# Patient Record
Sex: Female | Born: 1950 | Race: White | Hispanic: No | Marital: Married | Smoking: Never smoker
Health system: Southern US, Community
[De-identification: ages and names within clinical notes are randomized; demographics above are authoritative.]

## PROBLEM LIST (undated history)

## (undated) DIAGNOSIS — D869 Sarcoidosis, unspecified: Secondary | ICD-10-CM

## (undated) DIAGNOSIS — E039 Hypothyroidism, unspecified: Secondary | ICD-10-CM

## (undated) DIAGNOSIS — Z9889 Other specified postprocedural states: Secondary | ICD-10-CM

## (undated) DIAGNOSIS — R2990 Unspecified symptoms and signs involving the nervous system: Secondary | ICD-10-CM

## (undated) DIAGNOSIS — K219 Gastro-esophageal reflux disease without esophagitis: Secondary | ICD-10-CM

## (undated) DIAGNOSIS — E785 Hyperlipidemia, unspecified: Secondary | ICD-10-CM

## (undated) DIAGNOSIS — Z9109 Other allergy status, other than to drugs and biological substances: Secondary | ICD-10-CM

## (undated) DIAGNOSIS — I10 Essential (primary) hypertension: Secondary | ICD-10-CM

## (undated) DIAGNOSIS — R112 Nausea with vomiting, unspecified: Secondary | ICD-10-CM

## (undated) DIAGNOSIS — M1990 Unspecified osteoarthritis, unspecified site: Secondary | ICD-10-CM

## (undated) HISTORY — DX: Essential (primary) hypertension: I10

## (undated) HISTORY — PX: OTHER SURGICAL HISTORY: SHX169

## (undated) HISTORY — DX: Hypothyroidism, unspecified: E03.9

## (undated) HISTORY — DX: Sarcoidosis, unspecified: D86.9

## (undated) HISTORY — DX: Other allergy status, other than to drugs and biological substances: Z91.09

## (undated) HISTORY — DX: Hyperlipidemia, unspecified: E78.5

---

## 1976-12-07 HISTORY — PX: TUBAL LIGATION: SHX77

## 1994-07-09 HISTORY — PX: OTHER SURGICAL HISTORY: SHX169

## 1998-02-02 ENCOUNTER — Ambulatory Visit (HOSPITAL_COMMUNITY): Admission: RE | Admit: 1998-02-02 | Discharge: 1998-02-02 | Payer: Self-pay | Admitting: Neurosurgery

## 1998-03-05 ENCOUNTER — Ambulatory Visit (HOSPITAL_BASED_OUTPATIENT_CLINIC_OR_DEPARTMENT_OTHER): Admission: RE | Admit: 1998-03-05 | Discharge: 1998-03-05 | Payer: Self-pay | Admitting: Orthopedic Surgery

## 1998-04-08 HISTORY — PX: OTHER SURGICAL HISTORY: SHX169

## 1999-06-27 ENCOUNTER — Encounter: Admission: RE | Admit: 1999-06-27 | Discharge: 1999-06-27 | Payer: Self-pay | Admitting: Family Medicine

## 1999-06-27 ENCOUNTER — Encounter: Payer: Self-pay | Admitting: Family Medicine

## 1999-10-10 HISTORY — PX: OTHER SURGICAL HISTORY: SHX169

## 1999-10-18 ENCOUNTER — Other Ambulatory Visit: Admission: RE | Admit: 1999-10-18 | Discharge: 1999-10-18 | Payer: Self-pay | Admitting: Family Medicine

## 1999-10-22 ENCOUNTER — Other Ambulatory Visit: Admission: RE | Admit: 1999-10-22 | Discharge: 1999-10-22 | Payer: Self-pay | Admitting: Gynecology

## 1999-10-22 ENCOUNTER — Encounter (INDEPENDENT_AMBULATORY_CARE_PROVIDER_SITE_OTHER): Payer: Self-pay | Admitting: Specialist

## 1999-11-06 ENCOUNTER — Encounter: Payer: Self-pay | Admitting: Orthopaedic Surgery

## 1999-11-06 ENCOUNTER — Encounter: Admission: RE | Admit: 1999-11-06 | Discharge: 1999-11-06 | Payer: Self-pay | Admitting: Orthopaedic Surgery

## 1999-12-08 HISTORY — PX: OTHER SURGICAL HISTORY: SHX169

## 1999-12-13 ENCOUNTER — Encounter: Payer: Self-pay | Admitting: Family Medicine

## 1999-12-13 ENCOUNTER — Encounter: Admission: RE | Admit: 1999-12-13 | Discharge: 1999-12-13 | Payer: Self-pay | Admitting: Family Medicine

## 1999-12-27 ENCOUNTER — Encounter: Payer: Self-pay | Admitting: Surgery

## 2000-01-01 ENCOUNTER — Ambulatory Visit (HOSPITAL_COMMUNITY): Admission: RE | Admit: 2000-01-01 | Discharge: 2000-01-02 | Payer: Self-pay | Admitting: Surgery

## 2000-01-01 ENCOUNTER — Encounter (INDEPENDENT_AMBULATORY_CARE_PROVIDER_SITE_OTHER): Payer: Self-pay | Admitting: *Deleted

## 2000-03-09 ENCOUNTER — Encounter: Payer: Self-pay | Admitting: Gastroenterology

## 2000-03-09 ENCOUNTER — Ambulatory Visit (HOSPITAL_COMMUNITY): Admission: RE | Admit: 2000-03-09 | Discharge: 2000-03-09 | Payer: Self-pay | Admitting: Gastroenterology

## 2000-05-15 ENCOUNTER — Encounter: Admission: RE | Admit: 2000-05-15 | Discharge: 2000-05-15 | Payer: Self-pay | Admitting: Gastroenterology

## 2000-05-15 ENCOUNTER — Encounter: Payer: Self-pay | Admitting: Gastroenterology

## 2000-06-12 ENCOUNTER — Ambulatory Visit (HOSPITAL_COMMUNITY): Admission: RE | Admit: 2000-06-12 | Discharge: 2000-06-12 | Payer: Self-pay | Admitting: Gastroenterology

## 2000-06-30 ENCOUNTER — Encounter: Admission: RE | Admit: 2000-06-30 | Discharge: 2000-06-30 | Payer: Self-pay | Admitting: Family Medicine

## 2000-06-30 ENCOUNTER — Encounter: Payer: Self-pay | Admitting: Family Medicine

## 2000-11-03 ENCOUNTER — Other Ambulatory Visit: Admission: RE | Admit: 2000-11-03 | Discharge: 2000-11-03 | Payer: Self-pay | Admitting: Gynecology

## 2001-03-18 ENCOUNTER — Encounter: Payer: Self-pay | Admitting: Gastroenterology

## 2001-03-18 ENCOUNTER — Encounter: Admission: RE | Admit: 2001-03-18 | Discharge: 2001-03-18 | Payer: Self-pay | Admitting: Gastroenterology

## 2001-07-01 ENCOUNTER — Encounter: Payer: Self-pay | Admitting: Family Medicine

## 2001-07-01 ENCOUNTER — Encounter: Admission: RE | Admit: 2001-07-01 | Discharge: 2001-07-01 | Payer: Self-pay | Admitting: Family Medicine

## 2001-11-04 ENCOUNTER — Other Ambulatory Visit: Admission: RE | Admit: 2001-11-04 | Discharge: 2001-11-04 | Payer: Self-pay | Admitting: Gynecology

## 2002-07-05 ENCOUNTER — Encounter: Admission: RE | Admit: 2002-07-05 | Discharge: 2002-07-05 | Payer: Self-pay | Admitting: Family Medicine

## 2002-07-05 ENCOUNTER — Encounter: Payer: Self-pay | Admitting: Family Medicine

## 2002-08-02 ENCOUNTER — Encounter: Payer: Self-pay | Admitting: Cardiology

## 2002-08-02 ENCOUNTER — Encounter: Admission: RE | Admit: 2002-08-02 | Discharge: 2002-08-02 | Payer: Self-pay | Admitting: Cardiology

## 2002-08-24 ENCOUNTER — Ambulatory Visit (HOSPITAL_COMMUNITY): Admission: RE | Admit: 2002-08-24 | Discharge: 2002-08-24 | Payer: Self-pay | Admitting: Cardiology

## 2002-08-24 ENCOUNTER — Encounter (INDEPENDENT_AMBULATORY_CARE_PROVIDER_SITE_OTHER): Payer: Self-pay | Admitting: Cardiology

## 2002-09-08 DIAGNOSIS — D869 Sarcoidosis, unspecified: Secondary | ICD-10-CM

## 2002-09-08 HISTORY — DX: Sarcoidosis, unspecified: D86.9

## 2002-09-08 HISTORY — PX: MEDIASTINOSCOPY: SUR861

## 2002-11-17 ENCOUNTER — Other Ambulatory Visit: Admission: RE | Admit: 2002-11-17 | Discharge: 2002-11-17 | Payer: Self-pay | Admitting: Gynecology

## 2002-12-29 ENCOUNTER — Encounter: Admission: RE | Admit: 2002-12-29 | Discharge: 2003-01-13 | Payer: Self-pay | Admitting: Otolaryngology

## 2003-02-27 ENCOUNTER — Encounter: Payer: Self-pay | Admitting: Family Medicine

## 2003-02-27 ENCOUNTER — Encounter: Admission: RE | Admit: 2003-02-27 | Discharge: 2003-02-27 | Payer: Self-pay | Admitting: Family Medicine

## 2003-03-09 ENCOUNTER — Encounter: Admission: RE | Admit: 2003-03-09 | Discharge: 2003-03-09 | Payer: Self-pay | Admitting: Family Medicine

## 2003-03-09 ENCOUNTER — Encounter: Payer: Self-pay | Admitting: Family Medicine

## 2003-03-15 ENCOUNTER — Encounter: Payer: Self-pay | Admitting: Family Medicine

## 2003-03-15 ENCOUNTER — Encounter: Admission: RE | Admit: 2003-03-15 | Discharge: 2003-03-15 | Payer: Self-pay | Admitting: Family Medicine

## 2003-03-20 ENCOUNTER — Encounter: Payer: Self-pay | Admitting: Thoracic Surgery

## 2003-03-22 ENCOUNTER — Ambulatory Visit (HOSPITAL_COMMUNITY): Admission: RE | Admit: 2003-03-22 | Discharge: 2003-03-22 | Payer: Self-pay | Admitting: Thoracic Surgery

## 2003-03-22 ENCOUNTER — Encounter (INDEPENDENT_AMBULATORY_CARE_PROVIDER_SITE_OTHER): Payer: Self-pay | Admitting: Specialist

## 2003-06-12 ENCOUNTER — Other Ambulatory Visit: Admission: RE | Admit: 2003-06-12 | Discharge: 2003-06-12 | Payer: Self-pay | Admitting: Family Medicine

## 2003-07-28 ENCOUNTER — Encounter: Admission: RE | Admit: 2003-07-28 | Discharge: 2003-07-28 | Payer: Self-pay | Admitting: Family Medicine

## 2004-06-20 ENCOUNTER — Other Ambulatory Visit: Admission: RE | Admit: 2004-06-20 | Discharge: 2004-06-20 | Payer: Self-pay | Admitting: Family Medicine

## 2004-07-30 ENCOUNTER — Encounter: Admission: RE | Admit: 2004-07-30 | Discharge: 2004-07-30 | Payer: Self-pay | Admitting: Family Medicine

## 2004-10-24 ENCOUNTER — Ambulatory Visit: Payer: Self-pay | Admitting: Internal Medicine

## 2004-11-01 ENCOUNTER — Ambulatory Visit (HOSPITAL_COMMUNITY): Admission: RE | Admit: 2004-11-01 | Discharge: 2004-11-01 | Payer: Self-pay | Admitting: Internal Medicine

## 2005-08-05 ENCOUNTER — Encounter: Admission: RE | Admit: 2005-08-05 | Discharge: 2005-08-05 | Payer: Self-pay | Admitting: Family Medicine

## 2006-08-07 ENCOUNTER — Encounter: Admission: RE | Admit: 2006-08-07 | Discharge: 2006-08-07 | Payer: Self-pay | Admitting: Family Medicine

## 2006-09-28 ENCOUNTER — Other Ambulatory Visit: Admission: RE | Admit: 2006-09-28 | Discharge: 2006-09-28 | Payer: Self-pay | Admitting: Family Medicine

## 2007-03-31 ENCOUNTER — Encounter: Admission: RE | Admit: 2007-03-31 | Discharge: 2007-05-26 | Payer: Self-pay | Admitting: Family Medicine

## 2007-08-10 ENCOUNTER — Encounter: Admission: RE | Admit: 2007-08-10 | Discharge: 2007-08-10 | Payer: Self-pay | Admitting: Family Medicine

## 2007-09-30 ENCOUNTER — Other Ambulatory Visit: Admission: RE | Admit: 2007-09-30 | Discharge: 2007-09-30 | Payer: Self-pay | Admitting: Family Medicine

## 2008-08-14 ENCOUNTER — Encounter: Admission: RE | Admit: 2008-08-14 | Discharge: 2008-08-14 | Payer: Self-pay | Admitting: Family Medicine

## 2008-10-23 ENCOUNTER — Other Ambulatory Visit: Admission: RE | Admit: 2008-10-23 | Discharge: 2008-10-23 | Payer: Self-pay | Admitting: Family Medicine

## 2009-08-15 ENCOUNTER — Encounter: Admission: RE | Admit: 2009-08-15 | Discharge: 2009-08-15 | Payer: Self-pay | Admitting: Family Medicine

## 2009-10-29 ENCOUNTER — Other Ambulatory Visit: Admission: RE | Admit: 2009-10-29 | Discharge: 2009-10-29 | Payer: Self-pay | Admitting: Family Medicine

## 2010-09-20 ENCOUNTER — Encounter
Admission: RE | Admit: 2010-09-20 | Discharge: 2010-09-20 | Payer: Self-pay | Source: Home / Self Care | Attending: Family Medicine | Admitting: Family Medicine

## 2011-01-21 ENCOUNTER — Ambulatory Visit (HOSPITAL_COMMUNITY)
Admission: RE | Admit: 2011-01-21 | Discharge: 2011-01-21 | Disposition: A | Discharge status: Home / Self Care | Payer: 59 | Source: Ambulatory Visit | Attending: Gastroenterology | Admitting: Gastroenterology

## 2011-01-21 DIAGNOSIS — R1310 Dysphagia, unspecified: Secondary | ICD-10-CM | POA: Insufficient documentation

## 2011-01-21 DIAGNOSIS — R059 Cough, unspecified: Secondary | ICD-10-CM | POA: Insufficient documentation

## 2011-01-21 DIAGNOSIS — R05 Cough: Secondary | ICD-10-CM | POA: Insufficient documentation

## 2011-01-21 DIAGNOSIS — R490 Dysphonia: Secondary | ICD-10-CM | POA: Insufficient documentation

## 2011-01-21 DIAGNOSIS — K219 Gastro-esophageal reflux disease without esophagitis: Secondary | ICD-10-CM | POA: Insufficient documentation

## 2011-01-24 NOTE — Discharge Summary (Signed)
NAME:  Yolanda Oconnell, Yolanda Oconnell NO.:  [id number]   MEDICAL RECORD NO.:  [id number]                   PATIENT TYPE:  OUT   LOCATION:  EKG                                  FACILITY:  [facility]   PHYSICIAN:  Ethelene Browns., M.D.            DATE OF BIRTH:  13-Feb-1951   DATE OF ADMISSION:  10/31/2002  DATE OF DISCHARGE:  11/03/2002                                 DISCHARGE SUMMARY   HISTORY OF PRESENT ILLNESS:  This 60 year old female with a chief complaint  of urgency, frequency every 20 to 30 minutes, and dysuria, has a history of  interstitial cystitis and recurrent urinary tract infections dating back to  1973, and has been followed in our office since 1985.  Her last cystoscopy  and hydrodistention was in 12/03.  She did well until about one to two weeks  ago when she began having increasing dysuria and frequency, no gross blood,  gravel, or stone passed.  The patient was admitted on the day prior to  surgery because of hemoglobin of 7, and Dr. Evlyn Kanner transfused her with 2  units of packed red blood cells.  On 11/01/02, she had cystoscopy and  hydrodistention, corpectomy, irrigation of the bladder, carried out under  general anesthesia.   Postoperatively, she initially seemed to do well, but on the following  morning, 11/02/02, was short of breath and had poor output.  Creatinine had  increased to 2.2 from 1.7, and she had interstitial edema on her chest x-  ray.  She was treated with IV Lasix by Dr. Evlyn Kanner, and she diuresed  sufficiently, felt much better on 11/03/02, and was discharged.  Creatinine  was still 2.2, BUN 54.  The patient did receive gentamycin 80 mg  preoperatively IV.   ALLERGIES:  1. PENICILLIN.  2. SULFA.  3. CIPRO.  4. KEFLEX.  5. CODEINE.   MEDICATIONS:  1. Insulin as ordered by Dr. Evlyn Kanner which is Novolog sliding scale, NPH 29     units q.a.m. and 19 units h.s.  2. Trazodone 50 mg daily.  3. Lotrel 5 to 10 mg daily.  4. Allopurinol  300 mg daily.  5. Prevacid 15 mg daily.  6. Lorcet one q.4h. p.r.n. pain.  7. Macrobid 100 mg daily p.r.n. for infections.  8. Paxil CR 25 mg daily.  9. Coronex 5 mg daily, and Dr. Evlyn Kanner is going to add to this medication     Lasix 20 mg daily at discharge.   The patient's past history, family history, social history, and review of  systems are well documented in the H&P.  Her problem list includes:   PROBLEM LIST:  1. Interstitial cystitis.  2. Diabetes mellitus with nephropathy.  3. Carcinoma of the breast in 1993, left mastectomy.  4. Anemia with hemoglobin 7, and after transfusion hemoglobin rose to 10.1.  5. Osteoarthritis.  6. Hiatal hernia.  7. Chronic obstructive pulmonary  disease.  8. Chronic anxiety.  9. History of pneumonia.   PHYSICAL EXAMINATION:  GENERAL:  A well-developed, obese, 60 year old  female.  VITAL SIGNS:  Blood pressure 142/60, respirations 20, pulse 72, temperature  98.  HEENT:  She is edentulous.  NECK:  Free of nodes, no enlarged thyroid.  CHEST:  Clear to auscultation and percussion.  RESPIRATORY:  Respiratory excursion is decreased.  HEART:  Normal sinus rhythm, no murmur.  BREASTS:  Left absent, right not examined.  ABDOMEN:  Obese.  There is a low midline scar x2, and a low transverse scar  with suprapubic tenderness.  Liver, kidneys, and spleen mass or hernia not  detected.  PELVIC:  The bladder is very tender.  Rectal tone is poor, and support is  poor.  EXTREMITIES:  No edema, good pulses.  NEUROLOGIC:  She seemed intact.  SKIN:  No rashes.  LYMPHATIC:  No nodes.   LABORATORY DATA:  The x-ray and laboratory findings reveal cardiomegaly,  mild interstitial edema postoperatively.  Her labs at discharge showed a  white blood cell count of 14,300, hemoglobin 10.1, hematocrit 31.  On  admission, her hematocrit had been 22, and her hemoglobin was 7.3.  Creatinine 2.2, BUN 54, potassium was 5.8.  The urine culture showed 9000  colonies of  insignificant growth.  The cardiac studies done by Dr. Evlyn Kanner did  not show any evidence of infarction.  EKG showed sinus bradycardia,  otherwise unremarkable with a rate of 58.   DISCHARGE DIAGNOSES:  1. Interstitial cystitis.  2. Recurrent urinary tract infections since 1973.  3. Insulin-dependent diabetes mellitus with nephropathy, creatinine 2.2.  4. Cancer of the breast, status post left mastectomy in 1993.  5. Anemia secondary to peptic ulcer disease, chronic transfusions.     Discharge hemoglobin 10.1.  6. Osteoarthritis.  7. Hiatal hernia.  8. Chronic obstructive pulmonary disease.  9. Chronic anxiety.  10.      History of pneumonia.   OPERATION:  Cystoscopy, hydrodistention, corpectomy, irrigation of the  bladder.   DIET:  ADA 1800 calorie diet.   ACTIVITY:  Light activity.   DISCHARGE INSTRUCTIONS:  1. Dr. Evlyn Kanner will continue insulin and medication orders as noted above.  2. Lorcet one or two q.4-6h. p.r.n. pain.   FOLLOWUP:  1. She is to return to see Dr. Evlyn Kanner in two weeks for BUN and creatinine.  2. See Dr. Elige Radon in four weeks.   CONDITION ON DISCHARGE:  Improved.                                                 Ethelene Browns., M.D.    HB/MEDQ  D:  11/03/2002  T:  11/03/2002  Job:  [id number]

## 2011-01-24 NOTE — Op Note (Signed)
[facility]  Patient:    Yolanda Oconnell, Yolanda Oconnell                        MRN: [id number] Proc. Date: 01/01/00 Adm. Date:  [id number] Attending:  Charlton Haws CC:         Abran Cantor. Clovis Riley, M.D.                           Operative Report  ACCOUNT:  [id number]   -   OFFICE:  #[id number]  PREOPERATIVE DIAGNOSIS:  Gallstones with chronic calculus cholecystitis.  POSTOPERATIVE DIAGNOSIS:   Gallstones with chronic calculus cholecystitis.  OPERATION:  Laparoscopic cholecystectomy.  SURGEON:  Currie Paris, M.D.  ASSISTANT:  Donnie Coffin. Samuella Cota, M.D.  ANESTHESIA:  General endotracheal.  INDICATIONS:  This patient is a 60 year old with fairly typical biliary symptoms and multiple stones seen on ultrasound.  She has also had some reflux problems.  DESCRIPTION OF PROCEDURE:  The patient was brought to the operating room. After satisfactory general endotracheal anesthesia had been obtained, the abdomen was  prepped and draped.  Using 0.25% Marcaine at the incision site, I made an umbilical incision and opened the fascia and entered the peritoneal cavity under direct vision.  The Hasson was introduced and the abdomen was insufflated to 15.  The patient was placed in the reverse Trendelenburg, and three additional cannulas ere placed in the usual positions.  I attempted to visualize the esophageal hiatus, and there did not appear to be a grossly-obvious large hiatal hernia, but with trocars on the right side of the abdomen, we were not able to get full visualization. he remainder of the abdomen looked normal, so we began on the cholecystectomy. The gallbladder was grasped and retracted over the liver.  The dissection was begun  around the cystic duct and the triangle of Calots opened and identified.  The cystic artery appeared to be behind the cystic duct, with a small branch that I was able to dissect off the cystic duct.  I was able to dissect  the cystic duct and  identify its junction with the gallbladder, as well as its junction with the common duct.  It was triply clipped and divided.  The small branch of the artery was clipped once and divided.  The main artery was triple-clipped and divided.  The  gallbladder was removed from below to above using coagulation current of the cautery.  Just prior to disconnecting I made sure that the bed of the gallbladder appeared to be dry.  It was checked again at the end of the case.  Her gallbladder was brought out the umbilical port.  The abdomen was reinsufflated and remained hemostatic, and a final irrigation done.  The lateral ports were removed.  There was no bleeding.  The umbilical site was closed with a pursestring. The abdomen was desufflated through the epigastric port and skin incisions closed with #4-0 monocryl subcuticular plus Steri-Strips.  The patient tolerated the procedure well.  There were no operative complications. All counts were correct. DD:  12/30/99 TD:  01/02/00 Job: [id number]

## 2011-01-24 NOTE — Op Note (Signed)
   NAME:  JASKIRAN, PATA                           ACCOUNT NO.:  [id number]   MEDICAL RECORD NO.:  [id number]                   PATIENT TYPE:  OIB   LOCATION:  NA                                   FACILITY:  [facility]   PHYSICIAN:  Ines Bloomer, M.D.              DATE OF BIRTH:  12/31/1950   DATE OF PROCEDURE:  03/22/2003  DATE OF DISCHARGE:                                 OPERATIVE REPORT   PREOPERATIVE DIAGNOSIS:  Mediastinal adenopathy.   POSTOPERATIVE DIAGNOSIS:  Mediastinal adenopathy.   OPERATION PERFORMED:  Fiberoptic bronchoscopy and mediastinoscopy.   SURGEON:  Ines Bloomer, M.D.   ANESTHESIA:  General.   DESCRIPTION OF PROCEDURE:  Fiberoptic bronchoscope was passed through the  endotracheal tube.  Carina was midline.  The right upper lobe, right middle  lobe and right lower lobe orifices were normal.  The left upper lobe and  left lower lobe orifices were normal.  The fiberoptic bronchoscope was  removed, the anterior neck was prepped and draped in the usual sterile  manner.  A transverse incision was made and carried down with electrocautery  through the subcutaneous tissue and fascia.  The pretracheal fascia was  entered and digital exploration was carried out.  The mediastinoscope was  inserted and biopsies of 2R times two and 4R nodes were done.  Frozen  section revealed sarcoid.  Strap muscles were closed with 2-0 Vicryl,  subcutaneous tissue with 3-0 Vicryl and Dermabond to the skin.  The patient  returned to the recovery room in stable condition.                                                Ines Bloomer, M.D.    DPB/MEDQ  D:  03/22/2003  T:  03/22/2003  Job:  [id number]

## 2011-02-06 NOTE — Op Note (Signed)
  NAMEKALIS, FRIESE NO.:  [id number]  MEDICAL RECORD NO.:  [id number]           PATIENT TYPE:  O  LOCATION:  WLEN                         FACILITY:  [facility]  PHYSICIAN:  Shirley Friar, MDDATE OF BIRTH:  February 08, 1951  DATE OF PROCEDURE: DATE OF DISCHARGE:                              OPERATIVE REPORT   INDICATIONS:  Esophageal reflux, hoarseness, cough, dysphagia.  MEDICATIONS:  Fentanyl 62.5 mcg IV, Versed 6 mg IV, Cetacaine spray x2.  FINDINGS:  The endoscope was inserted through the oropharynx and the esophagus was intubated, which revealed clear bilious fluid throughout the esophagus and reflux from the stomach.  There were no mucosal abnormalities noted.  The gastroesophageal junction was approximately 38 cm from the incisors.  The endoscope was advanced into the stomach, which revealed normal-appearing gastric mucosa.  Retroflexion was done, which revealed a normal proximal stomach.  The endoscope was straightened and advanced into the duodenal bulb and the second portion of the duodenum, which were unremarkable.  The endoscope was withdrawn back into the stomach and back into the gastroesophageal junction, which was again noted at 38 cm from the incisors.  The endoscope was withdrawn and a Bravo capsule catheter was inserted in the usual fashion after marking 6 cm above the GE junction.  The Bravo capsule was deployed in the usual fashion and the catheter was removed.  The endoscope was reinserted and confirmed successful placement of the Bravo capsule without any immediate complications.  The endoscope was then withdrawn.  ASSESSMENT: 1. Reflux noted during endoscopy.  Otherwise normal upper endoscopy. 2. Status post Bravo capsule placement in the esophagus.  PLAN:  Follow up on Bravo capsule results when ready and Bravo capsule measurement is being done on proton pump inhibitor therapy.     Shirley Friar, MD     VCS/MEDQ  D:   01/21/2011  T:  01/21/2011  Job:  [id number]  cc:   Dario Guardian, M.D. Fax: [phone]  Electronically Signed by Charlott Rakes MD on 02/06/2011 10:45:35 PM

## 2011-04-07 ENCOUNTER — Other Ambulatory Visit: Payer: Self-pay | Admitting: Family Medicine

## 2011-04-07 DIAGNOSIS — N6310 Unspecified lump in the right breast, unspecified quadrant: Secondary | ICD-10-CM

## 2011-04-08 ENCOUNTER — Ambulatory Visit
Admission: RE | Admit: 2011-04-08 | Discharge: 2011-04-08 | Disposition: A | Discharge status: Home / Self Care | Payer: 59 | Source: Ambulatory Visit | Attending: Family Medicine | Admitting: Family Medicine

## 2011-04-08 DIAGNOSIS — N6310 Unspecified lump in the right breast, unspecified quadrant: Secondary | ICD-10-CM

## 2011-09-24 ENCOUNTER — Other Ambulatory Visit: Payer: Self-pay | Admitting: Family Medicine

## 2011-09-24 DIAGNOSIS — Z1231 Encounter for screening mammogram for malignant neoplasm of breast: Secondary | ICD-10-CM

## 2011-10-14 ENCOUNTER — Ambulatory Visit: Payer: 59

## 2011-11-11 ENCOUNTER — Ambulatory Visit
Admission: RE | Admit: 2011-11-11 | Discharge: 2011-11-11 | Disposition: A | Discharge status: Home / Self Care | Payer: 59 | Source: Ambulatory Visit | Attending: Family Medicine | Admitting: Family Medicine

## 2011-11-11 DIAGNOSIS — Z1231 Encounter for screening mammogram for malignant neoplasm of breast: Secondary | ICD-10-CM

## 2011-12-16 ENCOUNTER — Ambulatory Visit (INDEPENDENT_AMBULATORY_CARE_PROVIDER_SITE_OTHER): Payer: 59 | Admitting: Internal Medicine

## 2011-12-16 ENCOUNTER — Ambulatory Visit (INDEPENDENT_AMBULATORY_CARE_PROVIDER_SITE_OTHER)
Admission: RE | Admit: 2011-12-16 | Discharge: 2011-12-16 | Disposition: A | Discharge status: Home / Self Care | Payer: 59 | Source: Ambulatory Visit | Attending: Internal Medicine | Admitting: Internal Medicine

## 2011-12-16 ENCOUNTER — Encounter: Payer: Self-pay | Admitting: Internal Medicine

## 2011-12-16 ENCOUNTER — Other Ambulatory Visit: Payer: 59

## 2011-12-16 VITALS — BP 114/80 | HR 77 | Ht 64.0 in | Wt 192.6 lb

## 2011-12-16 DIAGNOSIS — R05 Cough: Secondary | ICD-10-CM

## 2011-12-16 DIAGNOSIS — D869 Sarcoidosis, unspecified: Secondary | ICD-10-CM

## 2011-12-16 NOTE — Progress Notes (Signed)
Quick Note:  Pt aware of results. ______ 

## 2011-12-16 NOTE — Patient Instructions (Signed)
Order- lab- ACE level- dx sarcoid  Order- CXR- dx sarcoid  Take your Protonix acid blocker before meals. Avoid lying down for at least 1/ hour after eating.  Consider propping up the head end of your bed frame about the height of a brick. Avoid peppermint.  Samples were given of Benicar/HCT  40/1.5.  Take 1/ tab daily instead of your lisinopril/ HCT. If the lisinopril is contributing to your cough you should see gradual improvement. More than one trigger may be contributing to the cough, so no one fix may take care of all of it.

## 2011-12-16 NOTE — Progress Notes (Signed)
12/16/11- 21 yoF never smoker coming for evaluation of cough with concern it might be related to the past history of sarcoid. CT scan in 2004 had demonstrated granulomatous mediastinal nodes. Mediastinoscopy had shown noncaseating granulomas consistent with sarcoid. ACE level at that time was normal at 30 and she was not treated. In the last 2 years she has noticed raspy voice. Endoscopy and capsule study diagnosed reflux. She keeps the same persistent cough despite proton X. twice daily and she wants reassurance that is not sarcoid. Cough is mostly dry. Admits night sweats persistent despite fluoxetine. She is on chronic lisinopril which we discussed. Allergy testing by Dr. Willa Rough was positive only for dust mite and she was not on allergy vaccine. Treats allergic rhinitis with pattern ACE nasal spray. Admits shortness of breath on exertion climbing 2 flights of stairs but it may be related to fitness. Cough seems unaffected by external environment. Denies history of DVT or heart problems except occasional palpitation.  Prior to Admission medications   Medication Sig Start Date End Date Taking? Authorizing Provider  ALPRAZolam Prudy Feeler) 0.5 MG tablet Take 0.5 mg by mouth as needed.   Yes Historical Provider, MD  aspirin 81 MG tablet Take 81 mg by mouth daily.   Yes Historical Provider, MD  Calcium Carbonate-Vitamin D (CALCIUM 600 + D PO) Take 2 capsules by mouth daily.   Yes Historical Provider, MD  Cholecalciferol (VITAMIN D) 2000 UNITS CAPS Take 1 capsule by mouth daily.   Yes Historical Provider, MD  etodolac (LODINE) 500 MG tablet Take 500 mg by mouth 2 (two) times daily.   Yes Historical Provider, MD  fexofenadine (ALLEGRA) 180 MG tablet Take 180 mg by mouth daily.   Yes Historical Provider, MD  FLUoxetine (PROZAC) 40 MG capsule Take 40 mg by mouth daily.   Yes Historical Provider, MD  levothyroxine (SYNTHROID, LEVOTHROID) 75 MCG tablet Take 75 mcg by mouth daily.   Yes Historical Provider, MD    lisinopril-hydrochlorothiazide (PRINZIDE,ZESTORETIC) 10-12.5 MG per tablet Take 1 tablet by mouth daily.   Yes Historical Provider, MD  niacin 500 MG tablet Take 500 mg by mouth daily with breakfast.   Yes Historical Provider, MD  pantoprazole (PROTONIX) 40 MG tablet Take 40 mg by mouth daily.   Yes Historical Provider, MD  rosuvastatin (CRESTOR) 10 MG tablet Take 10 mg by mouth daily.   Yes Historical Provider, MD  zolpidem (AMBIEN) 10 MG tablet Take 5 mg by mouth at bedtime as needed.   Yes Historical Provider, MD   Past Medical History  Diagnosis Date  . Hyperlipidemia   . Hypertension   . Environmental allergies   . Sarcoidosis   . Hypothyroid    History   Social History  . Marital Status: Married    Spouse Name: N/A    Number of Children: N/A  . Years of Education: N/A   Occupational History  . Inventory control mgr    Social History Main Topics  . Smoking status: Never Smoker   . Smokeless tobacco: Not on file  . Alcohol Use: No  . Drug Use: No  . Sexually Active: Not on file   Other Topics Concern  . Not on file   Social History Narrative  . No narrative on file   Family History  Problem Relation Age of Onset  . Coronary artery disease Mother    ROS-see HPI Constitutional:   No-   weight loss, +night sweats,  No-fevers, chills, fatigue, lassitude. HEENT:   No-  headaches, difficulty swallowing, tooth/dental problems, sore throat,       No-  sneezing, itching, ear ache, nasal congestion, post nasal drip,  CV:  No-   chest pain, orthopnea, PND, swelling in lower extremities, anasarca, dizziness, palpitations Resp: +  shortness of breath with exertion or at rest.              No-   productive cough,  + non-productive cough,  No- coughing up of blood.              No-   change in color of mucus.  No- wheezing.   Skin: No-   rash or lesions. GI:  No-   heartburn, indigestion, abdominal pain, nausea, vomiting, diarrhea,                 change in bowel habits,  loss of appetite GU: No-   dysuria, change in color of urine, no urgency or frequency.  No- flank pain. MS:  No-   joint pain or swelling.  No- decreased range of motion.  No- back pain. Neuro-     nothing unusual Psych:  No- change in mood or affect. No depression or anxiety.  No memory loss.  OBJ- Physical Exam General- Alert, Oriented, Affect-appropriate, Distress- none acute Skin- rash-none, lesions- none, excoriation- none Lymphadenopathy- none Head- atraumatic            Eyes- Gross vision intact, PERRLA, conjunctivae and secretions clear            Ears- Hearing, canals-normal            Nose- Clear, no-Septal dev, mucus, polyps, erosion, perforation             Throat- Mallampati II , mucosa glandular , drainage- none, tonsils- atrophic, slight hoarse Neck- flexible , trachea midline, no stridor , thyroid nl, carotid no bruit Chest - symmetrical excursion , unlabored           Heart/CV- RRR , no murmur , no gallop  , no rub, nl s1 s2                           - JVD- none , edema- none, stasis changes- none, varices- none           Lung- clear to P&A, wheeze- none, cough- none , dullness-none, rub- none           Chest wall-  Abd- tender-no, distended-no, bowel sounds-present, HSM- no Br/ Gen/ Rectal- Not done, not indicated Extrem- cyanosis- none, clubbing, none, atrophy- none, strength- nl Neuro- grossly intact to observation

## 2011-12-17 NOTE — Progress Notes (Signed)
Quick Note:  LMTCB ______ 

## 2011-12-18 NOTE — Progress Notes (Signed)
Quick Note:  Pt aware of results. ______ 

## 2011-12-21 ENCOUNTER — Encounter: Payer: Self-pay | Admitting: Internal Medicine

## 2011-12-21 DIAGNOSIS — R05 Cough: Secondary | ICD-10-CM | POA: Insufficient documentation

## 2011-12-21 NOTE — Assessment & Plan Note (Signed)
ACE inhibitor/lisinopril is always suspect the problem is dry persistent cough. Plan-samples Benicar 40/12.5, one half tab daily  x1 month trial  Her noncaseating granulomatous mediastinal nodes on biopsy in 2004 were associated with a normal ACE level then and thought to represent possible burned out sarcoid. Plan- ACE level, chest x-ray  We discussed reflux as basis for cough and recommended she take her Protonix before meals.

## 2012-01-27 ENCOUNTER — Ambulatory Visit: Payer: 59 | Admitting: Internal Medicine

## 2012-11-10 ENCOUNTER — Other Ambulatory Visit: Payer: Self-pay

## 2012-11-10 DIAGNOSIS — Z1231 Encounter for screening mammogram for malignant neoplasm of breast: Secondary | ICD-10-CM

## 2012-11-11 ENCOUNTER — Ambulatory Visit
Admission: RE | Admit: 2012-11-11 | Discharge: 2012-11-11 | Disposition: A | Discharge status: Home / Self Care | Payer: 59 | Source: Ambulatory Visit

## 2012-11-11 DIAGNOSIS — Z1231 Encounter for screening mammogram for malignant neoplasm of breast: Secondary | ICD-10-CM

## 2012-11-17 ENCOUNTER — Other Ambulatory Visit: Payer: Self-pay | Admitting: Family Medicine

## 2012-11-17 ENCOUNTER — Other Ambulatory Visit (HOSPITAL_COMMUNITY)
Admission: RE | Admit: 2012-11-17 | Discharge: 2012-11-17 | Disposition: A | Discharge status: Home / Self Care | Payer: 59 | Source: Ambulatory Visit | Attending: Family Medicine | Admitting: Family Medicine

## 2012-11-17 DIAGNOSIS — Z0000 Encounter for general adult medical examination without abnormal findings: Secondary | ICD-10-CM | POA: Insufficient documentation

## 2012-12-27 ENCOUNTER — Encounter: Payer: Self-pay | Admitting: *Deleted

## 2013-10-20 ENCOUNTER — Other Ambulatory Visit: Payer: Self-pay

## 2013-10-20 DIAGNOSIS — Z1231 Encounter for screening mammogram for malignant neoplasm of breast: Secondary | ICD-10-CM

## 2013-11-16 ENCOUNTER — Ambulatory Visit
Admission: RE | Admit: 2013-11-16 | Discharge: 2013-11-16 | Disposition: A | Discharge status: Home / Self Care | Payer: Private Health Insurance - Indemnity | Source: Ambulatory Visit

## 2013-11-16 DIAGNOSIS — Z1231 Encounter for screening mammogram for malignant neoplasm of breast: Secondary | ICD-10-CM

## 2013-12-01 ENCOUNTER — Other Ambulatory Visit: Payer: Self-pay

## 2014-08-21 ENCOUNTER — Encounter: Payer: Self-pay | Admitting: Dietician

## 2014-08-21 ENCOUNTER — Encounter: Payer: Private Health Insurance - Indemnity | Attending: Family Medicine | Admitting: Dietician

## 2014-08-21 VITALS — Ht 64.0 in | Wt 202.0 lb

## 2014-08-21 DIAGNOSIS — R7303 Prediabetes: Secondary | ICD-10-CM

## 2014-08-21 DIAGNOSIS — Z713 Dietary counseling and surveillance: Secondary | ICD-10-CM | POA: Insufficient documentation

## 2014-08-21 DIAGNOSIS — R7309 Other abnormal glucose: Secondary | ICD-10-CM | POA: Insufficient documentation

## 2014-08-21 NOTE — Progress Notes (Signed)
  Medical Nutrition Therapy:  Appt start time: 0345 end time:  0425   Assessment:  Primary concerns today: Yolanda Oconnell is here today reporting she has lost 14 lbs since September. She is here to discuss her HgbA1c of 6.4%. She states that lately she has been having a lot of salads with grilled chicken, limiting greasy foods, cutting back on portion sizes, and avoiding ice cream at night. 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 She lives with her husband. They both share the grocery shopping responsibilities. They eat out frequently, about 5 dinners per week. She plans to move at the end of January and is retiring at the end of December. She weighs herself 2-3x a week.      Preferred Learning Style:   No preference indicated   Learning Readiness:   Ready   MEDICATIONS: see list    DIETARY INTAKE:  Avoided foods include asparagus, eggplant, kale.    24-hr recall:  B ( AM): [place] sausage and egg on wheat English muffin, coffee with hazelnut creamer  Snk ( AM):   L ( PM): broiled tilapia over rice with broccoli, 2 rolls Snk ( PM):  D ( PM): salad  Snk ( PM):   Beverages: coffee with creamer, water, occasionally lemonade   Usual physical activity: plans to start when she retires and moves into a new house  Estimated energy needs: 1500-1600 calories 170-180 g carbohydrates 112-120 g protein 42-44 g fat  Progress Towards Goal(s):  In progress.   Nutritional Diagnosis:  [place]-2.2 Altered nutrition-related laboratory As related to history of excessive carbohydrate intake and overweight.  As evidenced by HgbA1c 6.4%.    Intervention:  Nutrition education provided. Recommended continued weight loss and exercise for improved glucose control. Identified carbohydrate foods and discussed appropriate portion sizes.  Goals: -Have something to eat every 3-5 hours -Have a protein food with every meal and snack -Make a list for the grocery store -Pre plan  meals and snacks -Limit portions of starches -Walking and water aerobics  -3x a week -Healthy weight loss: 1-2 pounds per week Goal weight: 150 lbs  Meal plan:  -Unlimited non-starchy vegetables -1/2 cup starch - ~3 oz lean meat   Teaching Method Utilized:  Visual Auditory Hands on  Handouts given during visit include:  Diabetes Sample meal plan  15g CHO + protein snacks  Meal planning card  Barriers to learning/adherence to lifestyle change: in the process of moving houses and retiring from job  Demonstrated degree of understanding via:  Teach Back   Monitoring/Evaluation:  Dietary intake, exercise, labs, and body weight prn.

## 2014-08-21 NOTE — Patient Instructions (Addendum)
-  Have something to eat every 3-5 hours  -Have a protein food with every meal and snack  -Make a list for the grocery store -Pre plan meals and snacks  -Limit portions of starches  -Walking and water aerobics  -3x a week  -Healthy weight loss: 1-2 pounds per week Goal weight: 150 lbs  Meal plan:  -Unlimited non-starchy vegetables -1/2 cup starch - ~3 oz lean meat

## 2015-01-05 ENCOUNTER — Other Ambulatory Visit: Payer: Self-pay | Admitting: Urology

## 2015-01-08 ENCOUNTER — Other Ambulatory Visit: Payer: Self-pay

## 2015-01-08 DIAGNOSIS — Z1231 Encounter for screening mammogram for malignant neoplasm of breast: Secondary | ICD-10-CM

## 2015-01-10 ENCOUNTER — Encounter (HOSPITAL_BASED_OUTPATIENT_CLINIC_OR_DEPARTMENT_OTHER): Payer: Self-pay | Admitting: *Deleted

## 2015-01-10 NOTE — Progress Notes (Signed)
To [facility] at 0730-plan to have CBC,Bmet ,PT/PTT drawn on 01/12/15-will need T&S ,Ekg day of procedure -instructed Npo after Mn-will take levothyroxine,protonix,claritin with small amt water in am-

## 2015-01-12 DIAGNOSIS — E039 Hypothyroidism, unspecified: Secondary | ICD-10-CM | POA: Diagnosis not present

## 2015-01-12 DIAGNOSIS — Z9049 Acquired absence of other specified parts of digestive tract: Secondary | ICD-10-CM | POA: Diagnosis not present

## 2015-01-12 DIAGNOSIS — I10 Essential (primary) hypertension: Secondary | ICD-10-CM | POA: Diagnosis not present

## 2015-01-12 DIAGNOSIS — K219 Gastro-esophageal reflux disease without esophagitis: Secondary | ICD-10-CM | POA: Diagnosis not present

## 2015-01-12 DIAGNOSIS — M1990 Unspecified osteoarthritis, unspecified site: Secondary | ICD-10-CM | POA: Diagnosis not present

## 2015-01-12 DIAGNOSIS — Z888 Allergy status to other drugs, medicaments and biological substances status: Secondary | ICD-10-CM | POA: Diagnosis not present

## 2015-01-12 DIAGNOSIS — Z9851 Tubal ligation status: Secondary | ICD-10-CM | POA: Diagnosis not present

## 2015-01-12 DIAGNOSIS — N393 Stress incontinence (female) (male): Secondary | ICD-10-CM | POA: Diagnosis not present

## 2015-01-12 DIAGNOSIS — E780 Pure hypercholesterolemia: Secondary | ICD-10-CM | POA: Diagnosis not present

## 2015-01-12 LAB — CBC
HEMATOCRIT: 39.3 % (ref 36.0–46.0)
Hemoglobin: 12.4 g/dL (ref 12.0–15.0)
MCH: 28.9 pg (ref 26.0–34.0)
MCHC: 31.6 g/dL (ref 30.0–36.0)
MCV: 91.6 fL (ref 78.0–100.0)
Platelets: 279 10*3/uL (ref 150–400)
RBC: 4.29 MIL/uL (ref 3.87–5.11)
RDW: 14.3 % (ref 11.5–15.5)
WBC: 7.7 10*3/uL (ref 4.0–10.5)

## 2015-01-12 LAB — TYPE AND SCREEN
ABO/RH(D): A POS
ANTIBODY SCREEN: NEGATIVE

## 2015-01-12 LAB — BASIC METABOLIC PANEL
Anion gap: 7 (ref 5–15)
BUN: 17 mg/dL (ref 6–20)
CO2: 25 mmol/L (ref 22–32)
CREATININE: 0.99 mg/dL (ref 0.44–1.00)
Calcium: 9.2 mg/dL (ref 8.9–10.3)
Chloride: 108 mmol/L (ref 101–111)
GFR, EST NON AFRICAN AMERICAN: 59 mL/min — AB (ref >60)
Glucose, Bld: 108 mg/dL — ABNORMAL HIGH (ref 70–99)
Potassium: 3.7 mmol/L (ref 3.5–5.1)
Sodium: 140 mmol/L (ref 135–145)

## 2015-01-12 LAB — APTT: aPTT: 28 seconds (ref 24–37)

## 2015-01-12 LAB — PROTIME-INR
INR: 0.95 (ref 0.00–1.49)
Prothrombin Time: 12.8 seconds (ref 11.6–15.2)

## 2015-01-16 ENCOUNTER — Ambulatory Visit (HOSPITAL_BASED_OUTPATIENT_CLINIC_OR_DEPARTMENT_OTHER): Payer: Medicare Other | Admitting: Anesthesiology

## 2015-01-16 ENCOUNTER — Encounter (HOSPITAL_BASED_OUTPATIENT_CLINIC_OR_DEPARTMENT_OTHER)
Admission: RE | Disposition: A | Discharge status: Home / Self Care | Payer: Self-pay | Source: Ambulatory Visit | Attending: Urology

## 2015-01-16 ENCOUNTER — Encounter (HOSPITAL_BASED_OUTPATIENT_CLINIC_OR_DEPARTMENT_OTHER): Payer: Self-pay | Admitting: *Deleted

## 2015-01-16 ENCOUNTER — Ambulatory Visit (HOSPITAL_BASED_OUTPATIENT_CLINIC_OR_DEPARTMENT_OTHER)
Admission: RE | Admit: 2015-01-16 | Discharge: 2015-01-16 | Disposition: A | Discharge status: Home / Self Care | Payer: Medicare Other | Source: Ambulatory Visit | Attending: Urology | Admitting: Urology

## 2015-01-16 DIAGNOSIS — I10 Essential (primary) hypertension: Secondary | ICD-10-CM | POA: Insufficient documentation

## 2015-01-16 DIAGNOSIS — E039 Hypothyroidism, unspecified: Secondary | ICD-10-CM | POA: Diagnosis not present

## 2015-01-16 DIAGNOSIS — E780 Pure hypercholesterolemia: Secondary | ICD-10-CM | POA: Insufficient documentation

## 2015-01-16 DIAGNOSIS — Z888 Allergy status to other drugs, medicaments and biological substances status: Secondary | ICD-10-CM | POA: Insufficient documentation

## 2015-01-16 DIAGNOSIS — Z9851 Tubal ligation status: Secondary | ICD-10-CM | POA: Insufficient documentation

## 2015-01-16 DIAGNOSIS — Z9049 Acquired absence of other specified parts of digestive tract: Secondary | ICD-10-CM | POA: Insufficient documentation

## 2015-01-16 DIAGNOSIS — M1990 Unspecified osteoarthritis, unspecified site: Secondary | ICD-10-CM | POA: Insufficient documentation

## 2015-01-16 DIAGNOSIS — N393 Stress incontinence (female) (male): Secondary | ICD-10-CM | POA: Diagnosis not present

## 2015-01-16 DIAGNOSIS — K219 Gastro-esophageal reflux disease without esophagitis: Secondary | ICD-10-CM | POA: Insufficient documentation

## 2015-01-16 HISTORY — DX: Nausea with vomiting, unspecified: R11.2

## 2015-01-16 HISTORY — PX: PUBOVAGINAL SLING: SHX1035

## 2015-01-16 HISTORY — DX: Other specified postprocedural states: Z98.890

## 2015-01-16 SURGERY — CREATION, PUBOVAGINAL SLING
Anesthesia: General | Site: Vagina

## 2015-01-16 MED ORDER — FENTANYL CITRATE (PF) 100 MCG/2ML IJ SOLN
25.0000 ug | INTRAMUSCULAR | Status: DC | PRN
Start: 2015-01-16 — End: 2015-01-16
  Filled 2015-01-16: qty 1

## 2015-01-16 MED ORDER — PROPOFOL 10 MG/ML IV BOLUS
INTRAVENOUS | Status: DC | PRN
  Administered 2015-01-16: 200 mg via INTRAVENOUS

## 2015-01-16 MED ORDER — DEXAMETHASONE SODIUM PHOSPHATE 4 MG/ML IJ SOLN
INTRAMUSCULAR | Status: DC | PRN
  Administered 2015-01-16: 10 mg via INTRAVENOUS

## 2015-01-16 MED ORDER — MEPERIDINE HCL 25 MG/ML IJ SOLN
6.2500 mg | INTRAMUSCULAR | Status: DC | PRN
  Filled 2015-01-16: qty 1

## 2015-01-16 MED ORDER — HYDROCODONE-ACETAMINOPHEN 5-325 MG PO TABS: 1.0000 | ORAL_TABLET | Freq: Four times a day (QID) | ORAL | Status: DC | PRN

## 2015-01-16 MED ORDER — LACTATED RINGERS IV SOLN
INTRAVENOUS | Status: DC
  Administered 2015-01-16 (×2): via INTRAVENOUS
  Filled 2015-01-16: qty 1000

## 2015-01-16 MED ORDER — SODIUM CHLORIDE 0.9 % IR SOLN
Status: DC | PRN
  Administered 2015-01-16: 500 mL

## 2015-01-16 MED ORDER — ONDANSETRON HCL 4 MG/2ML IJ SOLN
INTRAMUSCULAR | Status: DC | PRN
  Administered 2015-01-16: 4 mg via INTRAVENOUS

## 2015-01-16 MED ORDER — AMPICILLIN-SULBACTAM SODIUM 1.5 (1-0.5) G IJ SOLR
INTRAMUSCULAR | Status: AC
  Filled 2015-01-16: qty 1.5

## 2015-01-16 MED ORDER — NEOSTIGMINE METHYLSULFATE 10 MG/10ML IV SOLN
INTRAVENOUS | Status: DC | PRN
  Administered 2015-01-16: 3 mg via INTRAVENOUS

## 2015-01-16 MED ORDER — GENTAMICIN IN SALINE 1.6-0.9 MG/ML-% IV SOLN
80.0000 mg | INTRAVENOUS | Status: DC
  Administered 2015-01-16: 240 mg via INTRAVENOUS
  Filled 2015-01-16: qty 50

## 2015-01-16 MED ORDER — SCOPOLAMINE 1 MG/3DAYS TD PT72
1.0000 | MEDICATED_PATCH | TRANSDERMAL | Status: DC
  Administered 2015-01-16: 1.5 mg via TRANSDERMAL
  Filled 2015-01-16: qty 1

## 2015-01-16 MED ORDER — LIDOCAINE-EPINEPHRINE (PF) 1 %-1:200000 IJ SOLN
INTRAMUSCULAR | Status: DC | PRN
Start: 2015-01-16 — End: 2015-01-16
  Administered 2015-01-16: 4 mL

## 2015-01-16 MED ORDER — STERILE WATER FOR IRRIGATION IR SOLN
Status: DC | PRN
  Administered 2015-01-16: 3000 mL

## 2015-01-16 MED ORDER — LIDOCAINE HCL (CARDIAC) 20 MG/ML IV SOLN
INTRAVENOUS | Status: DC | PRN
  Administered 2015-01-16: 80 mg via INTRAVENOUS

## 2015-01-16 MED ORDER — PHENAZOPYRIDINE HCL 200 MG PO TABS
200.0000 mg | ORAL_TABLET | Freq: Once | ORAL | Status: AC
  Administered 2015-01-16: 200 mg via ORAL
  Filled 2015-01-16: qty 1

## 2015-01-16 MED ORDER — ACETAMINOPHEN 10 MG/ML IV SOLN
INTRAVENOUS | Status: DC | PRN
  Administered 2015-01-16: 1000 mg via INTRAVENOUS

## 2015-01-16 MED ORDER — PROMETHAZINE HCL 12.5 MG PO TABS
25.0000 mg | ORAL_TABLET | Freq: Three times a day (TID) | ORAL | Status: DC | PRN
Start: 2015-01-16 — End: 2017-03-25

## 2015-01-16 MED ORDER — ROCURONIUM BROMIDE 100 MG/10ML IV SOLN
INTRAVENOUS | Status: DC | PRN
  Administered 2015-01-16: 30 mg via INTRAVENOUS

## 2015-01-16 MED ORDER — MIDAZOLAM HCL 2 MG/2ML IJ SOLN
INTRAMUSCULAR | Status: AC
  Filled 2015-01-16: qty 2

## 2015-01-16 MED ORDER — SCOPOLAMINE 1 MG/3DAYS TD PT72
MEDICATED_PATCH | TRANSDERMAL | Status: AC
  Filled 2015-01-16: qty 1

## 2015-01-16 MED ORDER — SODIUM CHLORIDE 0.9 % IV SOLN
1.5000 g | INTRAVENOUS | Status: AC
  Administered 2015-01-16: 1.5 g via INTRAVENOUS
  Filled 2015-01-16: qty 1.5

## 2015-01-16 MED ORDER — ESTRADIOL 0.1 MG/GM VA CREA
TOPICAL_CREAM | VAGINAL | Status: DC | PRN
  Administered 2015-01-16: 1 via VAGINAL

## 2015-01-16 MED ORDER — MIDAZOLAM HCL 5 MG/5ML IJ SOLN
INTRAMUSCULAR | Status: DC | PRN
  Administered 2015-01-16: 2 mg via INTRAVENOUS

## 2015-01-16 MED ORDER — SUCCINYLCHOLINE CHLORIDE 20 MG/ML IJ SOLN
INTRAMUSCULAR | Status: DC | PRN
  Administered 2015-01-16: 100 mg via INTRAVENOUS

## 2015-01-16 MED ORDER — GENTAMICIN SULFATE 40 MG/ML IJ SOLN
5.0000 mg/kg | Freq: Once | INTRAMUSCULAR | Status: DC
  Filled 2015-01-16: qty 8.5

## 2015-01-16 MED ORDER — GLYCOPYRROLATE 0.2 MG/ML IJ SOLN
INTRAMUSCULAR | Status: DC | PRN
  Administered 2015-01-16: 0.4 mg via INTRAVENOUS

## 2015-01-16 MED ORDER — CIPROFLOXACIN HCL 250 MG PO TABS: 250.0000 mg | ORAL_TABLET | Freq: Two times a day (BID) | ORAL | Status: DC

## 2015-01-16 MED ORDER — FENTANYL CITRATE (PF) 100 MCG/2ML IJ SOLN
INTRAMUSCULAR | Status: DC | PRN
  Administered 2015-01-16 (×2): 25 ug via INTRAVENOUS
  Administered 2015-01-16 (×2): 50 ug via INTRAVENOUS

## 2015-01-16 MED ORDER — PHENAZOPYRIDINE HCL 100 MG PO TABS
ORAL_TABLET | ORAL | Status: AC
Start: 2015-01-16 — End: 2015-01-16
  Filled 2015-01-16: qty 2

## 2015-01-16 MED ORDER — FENTANYL CITRATE (PF) 100 MCG/2ML IJ SOLN
INTRAMUSCULAR | Status: AC
  Filled 2015-01-16: qty 4

## 2015-01-16 MED ORDER — FUROSEMIDE 10 MG/ML IJ SOLN
INTRAMUSCULAR | Status: DC | PRN
  Administered 2015-01-16: 10 mg via INTRAMUSCULAR

## 2015-01-16 MED ORDER — EPHEDRINE SULFATE 50 MG/ML IJ SOLN
INTRAMUSCULAR | Status: DC | PRN
Start: 2015-01-16 — End: 2015-01-16
  Administered 2015-01-16 (×2): 15 mg via INTRAVENOUS
  Administered 2015-01-16: 10 mg via INTRAVENOUS

## 2015-01-16 MED ORDER — GENTAMICIN SULFATE 40 MG/ML IJ SOLN
5.0000 mg/kg | INTRAVENOUS | Status: AC
  Filled 2015-01-16: qty 8.5

## 2015-01-16 SURGICAL SUPPLY — 59 items
BAG URINE DRAINAGE (UROLOGICAL SUPPLIES) IMPLANT
BLADE CLIPPER SURG (BLADE) ×2 IMPLANT
BLADE HEX COATED 2.75 (ELECTRODE) ×2 IMPLANT
BLADE SURG 10 STRL SS (BLADE) ×2 IMPLANT
BLADE SURG 15 STRL LF DISP TIS (BLADE) ×1 IMPLANT
BLADE SURG 15 STRL SS (BLADE) ×1
CANISTER SUCT LVC 12 LTR MEDI- (MISCELLANEOUS) IMPLANT
CANISTER SUCTION 1200CC (MISCELLANEOUS) ×2 IMPLANT
CANISTER SUCTION 2500CC (MISCELLANEOUS) ×4 IMPLANT
CATH FOLEY 2WAY SLVR  5CC 16FR (CATHETERS) ×1
CATH FOLEY 2WAY SLVR 5CC 16FR (CATHETERS) ×1 IMPLANT
CLOTH BEACON ORANGE TIMEOUT ST (SAFETY) ×2 IMPLANT
COVER BACK TABLE 60X90IN (DRAPES) ×2 IMPLANT
COVER MAYO STAND STRL (DRAPES) ×4 IMPLANT
DRAIN PENROSE 18X1/4 LTX STRL (WOUND CARE) IMPLANT
DRAPE UNDERBUTTOCKS STRL (DRAPE) ×2 IMPLANT
DRSG TELFA 3X8 NADH (GAUZE/BANDAGES/DRESSINGS) IMPLANT
ELECT REM PT RETURN 9FT ADLT (ELECTROSURGICAL) ×2
ELECTRODE REM PT RTRN 9FT ADLT (ELECTROSURGICAL) ×1 IMPLANT
GAUZE SPONGE 4X4 12PLY STRL LF (GAUZE/BANDAGES/DRESSINGS) IMPLANT
GAUZE SPONGE 4X4 16PLY XRAY LF (GAUZE/BANDAGES/DRESSINGS) IMPLANT
GLOVE BIO SURGEON STRL SZ 6.5 (GLOVE) ×2 IMPLANT
GLOVE BIO SURGEON STRL SZ7.5 (GLOVE) ×6 IMPLANT
GLOVE BIOGEL PI IND STRL 6.5 (GLOVE) ×1 IMPLANT
GLOVE BIOGEL PI IND STRL 7.5 (GLOVE) ×1 IMPLANT
GLOVE BIOGEL PI INDICATOR 6.5 (GLOVE) ×1
GLOVE BIOGEL PI INDICATOR 7.5 (GLOVE) ×1
GOWN STRL REUS W/ TWL LRG LVL3 (GOWN DISPOSABLE) ×1 IMPLANT
GOWN STRL REUS W/ TWL XL LVL3 (GOWN DISPOSABLE) ×2 IMPLANT
GOWN STRL REUS W/TWL LRG LVL3 (GOWN DISPOSABLE) ×1
GOWN STRL REUS W/TWL XL LVL3 (GOWN DISPOSABLE) ×6 IMPLANT
HOLDER FOLEY CATH W/STRAP (MISCELLANEOUS) ×2 IMPLANT
HOOK RETRACTION 12 ELAST STAY (MISCELLANEOUS) IMPLANT
LIQUID BAND (GAUZE/BANDAGES/DRESSINGS) ×2 IMPLANT
NEEDLE HYPO 22GX1.5 SAFETY (NEEDLE) ×2 IMPLANT
NS IRRIG 500ML POUR BTL (IV SOLUTION) IMPLANT
PACK BASIN DAY SURGERY FS (CUSTOM PROCEDURE TRAY) ×2 IMPLANT
PACKING VAGINAL (PACKING) ×2 IMPLANT
PENCIL BUTTON HOLSTER BLD 10FT (ELECTRODE) ×2 IMPLANT
PLUG CATH AND CAP STER (CATHETERS) ×2 IMPLANT
RETRACTOR STERILE 25.8CMX11.3 (INSTRUMENTS) IMPLANT
SET BERKELEY SUCTION TUBING (SUCTIONS) ×4 IMPLANT
SET IRRIG Y TYPE TUR BLADDER L (SET/KITS/TRAYS/PACK) ×2 IMPLANT
SHEET LAVH (DRAPES) ×2 IMPLANT
SLING SYSTEM SPARC (Sling) ×2 IMPLANT
SURGILUBE 2OZ TUBE FLIPTOP (MISCELLANEOUS) ×2 IMPLANT
SUT SILK 2 0 30  PSL (SUTURE)
SUT SILK 2 0 30 PSL (SUTURE) IMPLANT
SUT VIC AB 2-0 CT1 27 (SUTURE) ×2
SUT VIC AB 2-0 CT1 TAPERPNT 27 (SUTURE) ×2 IMPLANT
SUT VICRYL 4-0 PS2 18IN ABS (SUTURE) ×2 IMPLANT
SYR BULB IRRIGATION 50ML (SYRINGE) ×2 IMPLANT
SYR CONTROL 10ML LL (SYRINGE) ×2 IMPLANT
SYRINGE 10CC LL (SYRINGE) ×2 IMPLANT
TOWEL OR 17X24 6PK STRL BLUE (TOWEL DISPOSABLE) ×2 IMPLANT
TRAY DSU PREP LF (CUSTOM PROCEDURE TRAY) ×2 IMPLANT
TUBE CONNECTING 12X1/4 (SUCTIONS) ×4 IMPLANT
WATER STERILE IRR 500ML POUR (IV SOLUTION) IMPLANT
YANKAUER SUCT BULB TIP NO VENT (SUCTIONS) ×2 IMPLANT

## 2015-01-16 NOTE — Interval H&P Note (Signed)
History and Physical Interval Note:  0/05/2329 0:76 AM  Yolanda Oconnell  has presented today for surgery, with the diagnosis of STRESS INCONTINENCE  The various methods of treatment have been discussed with the patient and family. After consideration of risks, benefits and other options for treatment, the patient has consented to  Procedure(s): CYSTO SLING (N/A) as a surgical intervention .  The patient's history has been reviewed, patient examined, no change in status, stable for surgery.  I have reviewed the patient's chart and labs.  Questions were answered to the patient's satisfaction.     Nole Robey A

## 2015-01-16 NOTE — Anesthesia Preprocedure Evaluation (Addendum)
Anesthesia Evaluation  Patient identified by MRN, date of birth, ID band Patient awake    Reviewed: Allergy & Precautions, NPO status , Patient's Chart, lab work & pertinent test results  History of Anesthesia Complications (+) PONV  Airway Mallampati: II  TM Distance: >3 FB Neck ROM: Full    Dental no notable dental hx.  Upper front bridge:   Pulmonary  sarcoidosis breath sounds clear to auscultation  Pulmonary exam normal       Cardiovascular hypertension, Pt. on medications Normal cardiovascular examRhythm:Regular Rate:Normal     Neuro/Psych negative neurological ROS  negative psych ROS   GI/Hepatic Neg liver ROS, GERD-  Medicated and Controlled,  Endo/Other  negative endocrine ROS  Renal/GU negative Renal ROS  negative genitourinary   Musculoskeletal negative musculoskeletal ROS (+)   Abdominal   Peds negative pediatric ROS (+)  Hematology negative hematology ROS (+)   Anesthesia Other Findings   Reproductive/Obstetrics negative OB ROS                            Anesthesia Physical Anesthesia Plan  ASA: II  Anesthesia Plan: General   Post-op Pain Management:    Induction: Intravenous  Airway Management Planned: Oral ETT  Additional Equipment:   Intra-op Plan:   Post-operative Plan: Extubation in OR  Informed Consent: I have reviewed the patients History and Physical, chart, labs and discussed the procedure including the risks, benefits and alternatives for the proposed anesthesia with the patient or authorized representative who has indicated his/her understanding and acceptance.   Dental advisory given  Plan Discussed with: CRNA  Anesthesia Plan Comments:         Anesthesia Quick Evaluation

## 2015-01-16 NOTE — Anesthesia Postprocedure Evaluation (Signed)
  Anesthesia Post-op Note  Patient: Yolanda Oconnell  Procedure(s) Performed: Procedure(s) (LRB): CYSTO SLING SPARC (N/A)  Patient Location: PACU  Anesthesia Type: General  Level of Consciousness: awake and alert   Airway and Oxygen Therapy: Patient Spontanous Breathing  Post-op Pain: mild  Post-op Assessment: Post-op Vital signs reviewed, Patient's Cardiovascular Status Stable, Respiratory Function Stable, Patent Airway and No signs of Nausea or vomiting  Last Vitals:  Filed Vitals:   01/16/15 1130  BP: 109/67  Pulse: 66  Temp:   Resp: 16    Post-op Vital Signs: stable   Complications: No apparent anesthesia complications

## 2015-01-16 NOTE — Discharge Instructions (Signed)
I have reviewed discharge instructions in detail with the patient. They will follow-up with me or their physician as scheduled. My nurse will also be calling the patients as per protocol.     Urethral Vaginal Sling, Care After Refer to this sheet in the next few weeks. These instructions provide you with information on caring for yourself after your procedure. Your health care provider may also give you more specific instructions. Your treatment has been planned according to current medical practices, but problems sometimes occur. Call your health care provider if you have any problems or questions after your procedure.  WHAT TO EXPECT AFTER THE PROCEDURE  After your procedure, it is typical to have the following:  A catheter in your bladder until your bladder is able to work on its own properly. You will be instructed on how to empty the catheter bag.  Absorbable stitches in your incisions. They will slowly dissolve over 1-2 months. HOME CARE INSTRUCTIONS  Get plenty of rest.  Only take over-the-counter or prescription medicines as directed by your health care provider. Do not take aspirin because it can cause bleeding.  Do not take baths. Take showers until your health care provider tells you otherwise.  You may resume your usual diet. Eat a well-balanced diet.  Drink enough fluids to keep your urine clear or pale yellow.  Limit exercise and activities as directed by your health care provider. Do not lift anything heavier than 5 pounds (2.3 kg).  Do not douche, use tampons, or have sexual intercourse for 6 weeks after your procedure.  Follow up with your health care provider as directed. SEEK MEDICAL CARE IF:  You have a heavy or bad smelling vaginal discharge.   You have a rash.   You have pain that is not controlled with medicines.   You have lightheadedness or feel faint.  SEEK IMMEDIATE MEDICAL CARE IF:  You have a fever.   You have vaginal bleeding.   You  faint.   You have shortness of breath.   You have chest, abdominal, or leg pain.   You have pain when urinating or cannot urinate.   Your catheter is still in your bladder and becomes blocked.   You have swelling, redness, and pain in the vaginal area.  Document Released: 06/15/2013 Document Reviewed: 06/15/2013 [facility] Patient Information 2015 [place], [place]. This information is not intended to replace advice given to you by your health care provider. Make sure you discuss any questions you have with your health care provider.     Post Anesthesia Home Care Instructions  Activity: Get plenty of rest for the remainder of the day. A responsible adult should stay with you for 24 hours following the procedure.  For the next 24 hours, DO NOT: -Drive a car -Paediatric nurse -Drink alcoholic beverages -Take any medication unless instructed by your physician -Make any legal decisions or sign important papers.  Meals: Start with liquid foods such as gelatin or soup. Progress to regular foods as tolerated. Avoid greasy, spicy, heavy foods. If nausea and/or vomiting occur, drink only clear liquids until the nausea and/or vomiting subsides. Call your physician if vomiting continues.  Special Instructions/Symptoms: Your throat may feel dry or sore from the anesthesia or the breathing tube placed in your throat during surgery. If this causes discomfort, gargle with warm salt water. The discomfort should disappear within 24 hours.  If you had a scopolamine patch placed behind your ear for the management of post- operative nausea and/or vomiting:  1. The medication in the patch is effective for 72 hours, after which it should be removed.  Wrap patch in a tissue and discard in the trash. Wash hands thoroughly with soap and water. 2. You may remove the patch earlier than 72 hours if you experience unpleasant side effects which may include dry mouth, dizziness or visual disturbances. 3.  Avoid touching the patch. Wash your hands with soap and water after contact with the patch.

## 2015-01-16 NOTE — Transfer of Care (Signed)
Immediate Anesthesia Transfer of Care Note  Patient: Yolanda Oconnell  Procedure(s) Performed: Procedure(s): CYSTO SLING SPARC (N/A)  Patient Location: PACU  Anesthesia Type:General  Level of Consciousness: awake, alert , oriented and patient cooperative  Airway & Oxygen Therapy: Patient Spontanous Breathing and Patient connected to nasal cannula oxygen  Post-op Assessment: Report given to RN and Post -op Vital signs reviewed and stable  Post vital signs: Reviewed and stable  Last Vitals:  Filed Vitals:   01/16/15 0800  BP: 120/79  Pulse: 72  Temp: 36.7 C  Resp: 16    Complications: No apparent anesthesia complications

## 2015-01-16 NOTE — Op Note (Signed)
Preoperative diagnosis: Stress urinary incontinence Postoperative diagnosis: Stress urinary incontinence Procedure: Sling cystourethropexy [facility]) and cystoscopy Surgeon: Dr. Bjorn Loser  The patient has the above diagnoses and consented to the above procedure  The patient was prepped and draped in the usual fashion. Extra care was taken with leg positioning to minimize the  risk of compartment syndrome, neuropathy, and deep vein thrombosis. Preoperative laboratory tests were normal and preoperative antibiotics were given.  Two less than 1 cm incisions were made 1 fingerbreadth above the symphysis pubis 1.5 cm lateral to the midline. A 2 cm appropriate depth suburethral incision was made underneath the mid urethra after instilling approximately 5 cc of 1% lidocaine mixture. I sharply and bluntly dissected to the urethral vesical angle bilaterally. I was very diligent to make the incision at the mid urethra. She had a small cystocele and the findings were almost in keeping with a proximal previous sling and a small hinge effect. She also had what appeared to be almost scarring in the area she's not had previous bladder neck surgery.  With the bladder empty I passed a SPARC needle on top of and along the back of the symphysis pubis staying  on the periosteum and staying lateral using my box technique and delivering the needle onto the pulp of my  index finger bilaterally.  I cystoscoped the patient thoroughly and there was no injury to the bladder or urethra. There was no movement  or indentation of the bladder with movement of the trocar. There was excellent efflux of blue urine bilaterally.  With the bladder emptied I attached the [facility] sling and brought it up through the retropubic space bilaterally. I tensioned it over the fat part of a moderate size Kelly clamp. I cut below the blue dots, irrigated the sheaths, and removed the sheaths. I was very happy with the position and tension of  the sling With appropriate hypermobility and no springback effect.  All incisions were irrigated. The sling was cut below the skin level. I closed the anterior vaginal wall with  running 2-0 Vicryl followed by my interrupted sutures. Interrupted 4-0 Vicryl was used for the abdominal incisions.  There was some splitting of the anterior vaginal wall incision at the beginning of the case with finger dissection and I was overall very pleased with my closure with thick tissue overlying the sling  A catheter plug was used with the Foley catheter as well as a vaginal pack.  The patient was taken to the recovery room and hopefully this procedure will reach her treatment goal.

## 2015-01-16 NOTE — Anesthesia Procedure Notes (Signed)
Procedure Name: Intubation Date/Time: 01/16/2015 9:21 AM Performed by: Wanita Chamberlain Pre-anesthesia Checklist: Patient identified, Timeout performed, Emergency Drugs available, Suction available and Patient being monitored Patient Re-evaluated:Patient Re-evaluated prior to inductionOxygen Delivery Method: Circle system utilized Preoxygenation: Pre-oxygenation with 100% oxygen Intubation Type: IV induction Ventilation: Mask ventilation without difficulty Laryngoscope Size: Mac and 3 Grade View: Grade I Tube size: 7.0 mm Number of attempts: 1 Airway Equipment and Method: Stylet Placement Confirmation: positive ETCO2,  ETT inserted through vocal cords under direct vision and breath sounds checked- equal and bilateral Secured at: 21 cm Tube secured with: Tape Dental Injury: Teeth and Oropharynx as per pre-operative assessment  Comments: Intubated for surgeon preference for this procedure.

## 2015-01-16 NOTE — H&P (Signed)
History of Present Illness   I was consulted by Dr Carol Ada regarding Ms Petrucci' worsening urinary incontinence over a number of years. She saw Dr Reece Agar a number of years ago and she cannot remember if VESIcare or Enablex helped. She has seen our physical therapy team.   She currently leaks with coughing, sneezing, bending, and lifting. It is worse when she has a bad cough. She has urge incontinence and both are significant. She says she leaks a small amount not associated with awareness most of the day. She has mild enuresis but it is not nightly. She uses 4-5 pads a day sometimes damp and sometimes soaken.    When I saw Ms Pancake in early April she did not leak with Valsalva pressure of 120 mL of water. I thought her leakage was not associated with awareness and mild enuresis was due to an overactive bladder. She had failed a number of medications listed in detail 12/13/2014. I gave her Cloyd Stagers. My concern was persistent overactive bladder after a sling.   Oxybutynin had helped some. She had a lot of concerns about performing a sling, which was discussed in February 2016. Urine culture 12/13/2014 was negative.   Review of systems: No change in bowel or neurologic systems.   Urinalysis: I reviewed. Negative.  Frequency stable.   It turns out that Ms Glendinning was on _____ and now she is on her 2nd month of it. She states she has a better flow likely from holding more, but she still has the incontinence. She has a bad cough and she is leaking quite a bit with stress incontinence.  Yesterday morning she had trouble to urinate and again this morning. It feels like she has is having contractions. She says now she is back to her baseline. She had no other symptoms of urinary tract infection, but I sent it for culture.  There is no other modifying factors or associated signs or symptoms. There is no other aggravating or relieving factors. The presentation is milder in severity, but it has  improved.    Past Medical History Problems  1. History of arthritis (Z87.39) 2. History of heartburn (Z87.898) 3. History of hypercholesterolemia (Z86.39) 4. History of hypertension (Z86.79) 5. History of hypothyroidism (Z86.39)  Surgical History Problems  1. History of Cervical Vertebral Fusion 2. History of Cholecystectomy 3. History of Hand Surgery 4. History of Rotator Cuff Repair 5. History of Tubal Ligation  Current Meds 1. ALPRAZolam 0.5 MG Oral Tablet;  Therapy: (Recorded:02Apr2009) to Recorded 2. Ambien 10 MG Oral Tablet;  Therapy: (Recorded:01Dec2015) to Recorded 3. Aspirin 325 MG Oral Tablet;  Therapy: (Recorded:02Apr2009) to Recorded 4. Calcium TABS;  Therapy: (Recorded:02Apr2009) to Recorded 5. Crestor 10 MG Oral Tablet;  Therapy: (Recorded:02Apr2009) to Recorded 6. Diovan HCT 320-25 MG Oral Tablet;  Therapy: (Recorded:01Dec2015) to Recorded 7. Fosamax 70 MG Oral Tablet;  Therapy: (Recorded:01Dec2015) to Recorded 8. Levothyroxine Sodium 75 MCG Oral Tablet;  Therapy: (Recorded:02Apr2009) to Recorded 9. Multi-Vitamin TABS;  Therapy: (Recorded:01Dec2015) to Recorded 10. Niacin 500 MG Oral Tablet;   Therapy: (Recorded:02Apr2009) to Recorded 11. Oxybutynin Chloride ER 10 MG Oral Tablet Extended Release 24 Hour; Take 1 tablet   daily;   Therapy: [id number] to (Evaluate:05Dec2016); Last Rx:11Dec2015 Ordered 12. Protonix 40 MG Oral Tablet Delayed Release;   Therapy: (Recorded:01Dec2015) to Recorded 13. PROzac 40 MG Oral Capsule;   Therapy: (Recorded:01Dec2015) to Recorded 14. Toviaz 8 MG Oral Tablet Extended Release 24 Hour; Take 1 tablet daily;   Therapy: [id number] to (Evaluate:05Dec2016);  Last Rx:11Dec2015 Ordered 15. Trospium Chloride ER 60 MG Oral Capsule Extended Release 24 Hour; TAKE (1)   CAPSULE DAILY;   Therapy: 06Apr2016 to (Last Rx:06Apr2016) Ordered 16. Trospium Chloride ER 60 MG Oral Capsule Extended Release 24 Hour; TAKE (1)   CAPSULE DAILY;    Therapy: [id number] to (Last Rx:19Feb2016)  Requested for: [id number] Ordered 17. Vitamin D TABS;   Therapy: (Recorded:02Apr2009) to Recorded  Allergies Medication  1. Compazine TABS  Family History Problems  1. Family history of Death In The Family Father   35-alcohol related problem 2. Family history of Death of family member : Father   father passed @ age 34-alcoholism 52. Family history of Family Health Status - Mother's Age   85 4. Family history of Family Health Status Number Of Children   2 dgts 5. Family history of aplastic anemia (Z83.2) : Maternal Grandmother 6. Family history of class I angina pectoris (Z82.49) : Mother  Social History Problems  1. Activities Of Daily Living 2. Denied: Alcohol Use 3. Caffeine Use   3-4 4. Exercise Habits   No reg exer at this time 5. Living Independently 6. Marital History - Currently Married 7. Number of children   2 daughters 50. Occupation:   Pensions consultant 9. Self-reliant In Usual Daily Activities 10. Denied: Tobacco Use  Vitals Vital Signs [Data Includes: Last 1 Day]  Recorded: 26Apr2016 02:35PM  Blood Pressure: 101 / 70 Temperature: 98.1 F Heart Rate: 81  Results/Data     Assessment Assessed  1. Increased urinary frequency (R35.0) 2. Urge and stress incontinence (N39.46)  Discussion/Summary   Ms Marquart wants to proceed with a sling since she does not want to live her condition. I talked to her about theoretical refractory therapies, but it is not going to deal with her stress incontinence. I reviewed efficacy numbers for stress and urge incontinence with a sling. We fill out a sheet. She will meet Pam. She likely will schedule or at least she will call her. She wants to think about it longer. I think this is the best next logical step. I am empathetic with difficult choice.  After a thorough review of the management options for the patient's condition the patient  elected to proceed with  surgical therapy as noted above. We have discussed the potential benefits and risks of the procedure, side effects of the proposed treatment, the likelihood of the patient achieving the goals of the procedure, and any potential problems that might occur during the procedure or recuperation. Informed consent has been obtained.

## 2015-01-17 ENCOUNTER — Encounter (HOSPITAL_BASED_OUTPATIENT_CLINIC_OR_DEPARTMENT_OTHER): Payer: Self-pay | Admitting: Urology

## 2015-01-30 ENCOUNTER — Ambulatory Visit
Admission: RE | Admit: 2015-01-30 | Discharge: 2015-01-30 | Disposition: A | Discharge status: Home / Self Care | Payer: Medicare Other | Source: Ambulatory Visit

## 2015-01-30 DIAGNOSIS — Z1231 Encounter for screening mammogram for malignant neoplasm of breast: Secondary | ICD-10-CM

## 2016-01-22 ENCOUNTER — Other Ambulatory Visit (HOSPITAL_COMMUNITY)
Admission: RE | Admit: 2016-01-22 | Discharge: 2016-01-22 | Disposition: A | Discharge status: Home / Self Care | Payer: Medicare Other | Source: Ambulatory Visit | Attending: Family Medicine | Admitting: Family Medicine

## 2016-01-22 ENCOUNTER — Other Ambulatory Visit: Payer: Self-pay

## 2016-01-22 ENCOUNTER — Other Ambulatory Visit: Payer: Self-pay | Admitting: Family Medicine

## 2016-01-22 DIAGNOSIS — Z124 Encounter for screening for malignant neoplasm of cervix: Secondary | ICD-10-CM | POA: Insufficient documentation

## 2016-01-22 DIAGNOSIS — Z1231 Encounter for screening mammogram for malignant neoplasm of breast: Secondary | ICD-10-CM

## 2016-01-23 LAB — CYTOLOGY - PAP

## 2016-02-08 ENCOUNTER — Ambulatory Visit
Admission: RE | Admit: 2016-02-08 | Discharge: 2016-02-08 | Disposition: A | Discharge status: Home / Self Care | Payer: Medicare Other | Source: Ambulatory Visit

## 2016-02-08 DIAGNOSIS — Z1231 Encounter for screening mammogram for malignant neoplasm of breast: Secondary | ICD-10-CM

## 2016-02-26 ENCOUNTER — Ambulatory Visit
Admission: RE | Admit: 2016-02-26 | Discharge: 2016-02-26 | Disposition: A | Discharge status: Home / Self Care | Payer: Medicare Other | Source: Ambulatory Visit | Attending: Family Medicine | Admitting: Family Medicine

## 2016-02-26 ENCOUNTER — Other Ambulatory Visit: Payer: Self-pay | Admitting: Family Medicine

## 2016-02-26 DIAGNOSIS — J159 Unspecified bacterial pneumonia: Secondary | ICD-10-CM

## 2016-04-03 ENCOUNTER — Other Ambulatory Visit: Payer: Self-pay | Admitting: Gastroenterology

## 2016-05-07 ENCOUNTER — Encounter (HOSPITAL_COMMUNITY): Payer: Self-pay | Admitting: *Deleted

## 2016-05-13 ENCOUNTER — Ambulatory Visit (HOSPITAL_COMMUNITY)
Admission: RE | Admit: 2016-05-13 | Discharge: 2016-05-13 | Disposition: A | Discharge status: Home / Self Care | Payer: Medicare Other | Source: Ambulatory Visit | Attending: Gastroenterology | Admitting: Gastroenterology

## 2016-05-13 ENCOUNTER — Encounter (HOSPITAL_COMMUNITY)
Admission: RE | Disposition: A | Discharge status: Home / Self Care | Payer: Self-pay | Source: Ambulatory Visit | Attending: Gastroenterology

## 2016-05-13 ENCOUNTER — Ambulatory Visit (HOSPITAL_COMMUNITY): Payer: Medicare Other | Admitting: Anesthesiology

## 2016-05-13 ENCOUNTER — Encounter (HOSPITAL_COMMUNITY): Payer: Self-pay

## 2016-05-13 DIAGNOSIS — Z79899 Other long term (current) drug therapy: Secondary | ICD-10-CM | POA: Diagnosis not present

## 2016-05-13 DIAGNOSIS — E039 Hypothyroidism, unspecified: Secondary | ICD-10-CM | POA: Diagnosis not present

## 2016-05-13 DIAGNOSIS — Z1211 Encounter for screening for malignant neoplasm of colon: Secondary | ICD-10-CM | POA: Insufficient documentation

## 2016-05-13 DIAGNOSIS — I10 Essential (primary) hypertension: Secondary | ICD-10-CM | POA: Diagnosis not present

## 2016-05-13 DIAGNOSIS — K219 Gastro-esophageal reflux disease without esophagitis: Secondary | ICD-10-CM | POA: Insufficient documentation

## 2016-05-13 DIAGNOSIS — J309 Allergic rhinitis, unspecified: Secondary | ICD-10-CM | POA: Diagnosis not present

## 2016-05-13 DIAGNOSIS — E7800 Pure hypercholesterolemia, unspecified: Secondary | ICD-10-CM | POA: Diagnosis not present

## 2016-05-13 HISTORY — DX: Unspecified osteoarthritis, unspecified site: M19.90

## 2016-05-13 HISTORY — DX: Gastro-esophageal reflux disease without esophagitis: K21.9

## 2016-05-13 HISTORY — PX: COLONOSCOPY WITH PROPOFOL: SHX5780

## 2016-05-13 SURGERY — COLONOSCOPY WITH PROPOFOL
Anesthesia: Monitor Anesthesia Care

## 2016-05-13 MED ORDER — LIDOCAINE 2% (20 MG/ML) 5 ML SYRINGE
INTRAMUSCULAR | Status: AC
  Filled 2016-05-13: qty 5

## 2016-05-13 MED ORDER — ONDANSETRON HCL 4 MG/2ML IJ SOLN
INTRAMUSCULAR | Status: AC
  Filled 2016-05-13: qty 2

## 2016-05-13 MED ORDER — PROPOFOL 10 MG/ML IV BOLUS
INTRAVENOUS | Status: AC
  Filled 2016-05-13: qty 20

## 2016-05-13 MED ORDER — PROPOFOL 500 MG/50ML IV EMUL
INTRAVENOUS | Status: DC | PRN
  Administered 2016-05-13: 140 ug/kg/min via INTRAVENOUS

## 2016-05-13 MED ORDER — LACTATED RINGERS IV SOLN
INTRAVENOUS | Status: DC
  Administered 2016-05-13: 07:00:00 via INTRAVENOUS

## 2016-05-13 MED ORDER — LIDOCAINE 2% (20 MG/ML) 5 ML SYRINGE
INTRAMUSCULAR | Status: DC | PRN
  Administered 2016-05-13: 100 mg via INTRAVENOUS

## 2016-05-13 MED ORDER — PROPOFOL 10 MG/ML IV BOLUS
INTRAVENOUS | Status: AC
  Filled 2016-05-13: qty 40

## 2016-05-13 MED ORDER — ONDANSETRON HCL 4 MG/2ML IJ SOLN
INTRAMUSCULAR | Status: DC | PRN
  Administered 2016-05-13: 4 mg via INTRAVENOUS

## 2016-05-13 MED ORDER — SODIUM CHLORIDE 0.9 % IV SOLN: INTRAVENOUS | Status: DC

## 2016-05-13 MED ORDER — PROPOFOL 10 MG/ML IV BOLUS
INTRAVENOUS | Status: DC | PRN
  Administered 2016-05-13 (×3): 20 mg via INTRAVENOUS

## 2016-05-13 SURGICAL SUPPLY — 21 items

## 2016-05-13 NOTE — Transfer of Care (Signed)
Immediate Anesthesia Transfer of Care Note  Patient: Yolanda Oconnell  Procedure(s) Performed: Procedure(s): COLONOSCOPY WITH PROPOFOL (N/A)  Patient Location: Endoscopy Unit  Anesthesia Type:MAC  Level of Consciousness: sedated  Airway & Oxygen Therapy: Patient Spontanous Breathing and Patient connected to face mask oxygen  Post-op Assessment: Report given to RN and Post -op Vital signs reviewed and stable  Post vital signs: Reviewed and stable  Last Vitals:  Vitals:   05/13/16 0718  BP: 128/86  Pulse: 94  Resp: 17  Temp: 36.8 C    Last Pain:  Vitals:   05/13/16 0718  TempSrc: Oral         Complications: No apparent anesthesia complications

## 2016-05-13 NOTE — Discharge Instructions (Signed)

## 2016-05-13 NOTE — H&P (Signed)
  Procedure: Screening colonoscopy. Normal baseline screening colonoscopy was performed on 02/20/2006  History: The patient is a 65 year old female born September 03, 1951. She is scheduled to undergo a repeat screening colonoscopy today.  Past medical history: Sarcoidosis. Hypercholesterolemia. Hypertension. Hypothyroidism. Allergic rhinitis. Cervical spine fusion. Bilateral tubal ligation. Right rotator cuff surgery. Cholecystectomy.  Medication allergies: Compazine caused tardive  dyskinesia.  Exam: The patient is alert and lying comfortably on the endoscopy stretcher. Abdomen is soft and nontender to palpation. Lungs are clear to auscultation. Cardiac exam reveals a regular rhythm.  Plan: Proceed with screening colonoscopy

## 2016-05-13 NOTE — Anesthesia Postprocedure Evaluation (Signed)
Anesthesia Post Note  Patient: Yolanda Oconnell  Procedure(s) Performed: Procedure(s) (LRB): COLONOSCOPY WITH PROPOFOL (N/A)  Patient location during evaluation: PACU Anesthesia Type: MAC Level of consciousness: awake and alert Pain management: pain level controlled Vital Signs Assessment: post-procedure vital signs reviewed and stable Respiratory status: spontaneous breathing, nonlabored ventilation, respiratory function stable and patient connected to nasal cannula oxygen Cardiovascular status: stable and blood pressure returned to baseline Anesthetic complications: no    Last Vitals:  Vitals:   05/13/16 0850 05/13/16 0900  BP: 91/65 105/85  Pulse: 72 76  Resp: 18 (!) 21  Temp:      Last Pain:  Vitals:   05/13/16 0718  TempSrc: Oral                 [name] DAVID

## 2016-05-13 NOTE — Anesthesia Preprocedure Evaluation (Signed)
Anesthesia Evaluation  Patient identified by MRN, date of birth, ID band Patient awake    Reviewed: Allergy & Precautions, NPO status , Patient's Chart, lab work & pertinent test results  History of Anesthesia Complications (+) PONV  Airway Mallampati: I  TM Distance: >3 FB Neck ROM: Full    Dental   Pulmonary    Pulmonary exam normal        Cardiovascular hypertension, Pt. on medications Normal cardiovascular exam     Neuro/Psych    GI/Hepatic GERD  Medicated and Controlled,  Endo/Other  Hypothyroidism   Renal/GU      Musculoskeletal   Abdominal   Peds  Hematology   Anesthesia Other Findings   Reproductive/Obstetrics                             Anesthesia Physical Anesthesia Plan  ASA: II  Anesthesia Plan: MAC   Post-op Pain Management:    Induction: Intravenous  Airway Management Planned: Simple Face Mask  Additional Equipment:   Intra-op Plan:   Post-operative Plan: Extubation in OR  Informed Consent: I have reviewed the patients History and Physical, chart, labs and discussed the procedure including the risks, benefits and alternatives for the proposed anesthesia with the patient or authorized representative who has indicated his/her understanding and acceptance.     Plan Discussed with: CRNA and Surgeon  Anesthesia Plan Comments:         Anesthesia Quick Evaluation

## 2016-05-13 NOTE — Op Note (Signed)
[facility] Patient Name: Yolanda Oconnell Procedure Date: 05/13/2016 MRN: [id number] Attending MD: Garlan Fair , MD Date of Birth: Sep 18, 1950 CSN: [id number] Age: 65 Admit Type: Outpatient Procedure:                Colonoscopy Indications:              Screening for colorectal malignant neoplasm Providers:                Garlan Fair, MD, [facility],                            Technician, Danley Danker, CRNA Referring MD:              Medicines:                Propofol per Anesthesia Complications:            No immediate complications. Estimated Blood Loss:     Estimated blood loss: none. Procedure:                Pre-Anesthesia Assessment:                           - Prior to the procedure, a History and Physical                            was performed, and patient medications and                            allergies were reviewed. The patient's tolerance of                            previous anesthesia was also reviewed. The risks                            and benefits of the procedure and the sedation                            options and risks were discussed with the patient.                            All questions were answered, and informed consent                            was obtained. Prior Anticoagulants: The patient has                            taken no previous anticoagulant or antiplatelet                            agents. ASA Grade Assessment: II - A patient with                            mild systemic disease. After reviewing the risks  and benefits, the patient was deemed in                            satisfactory condition to undergo the procedure.                           After obtaining informed consent, the colonoscope                            was passed under direct vision. Throughout the                            procedure, the patient's blood pressure, pulse, and   oxygen saturations were monitored continuously. The                            [id number] scope was introduced through                            the anus and advanced to the the cecum, identified                            by appendiceal orifice and ileocecal valve. The                            colonoscopy was technically difficult and complex                            due to significant looping. The patient tolerated                            the procedure well. The quality of the bowel                            preparation was good. The appendiceal orifice and                            the rectum were photographed. Scope In: 8:17:50 AM Scope Out: 8:44:10 AM Scope Withdrawal Time: 0 hours 10 minutes 52 seconds  Total Procedure Duration: 0 hours 26 minutes 20 seconds  Findings:      The perianal and digital rectal examinations were normal.      The entire examined colon appeared normal. Impression:               - The entire examined colon is normal.                           - No specimens collected. Moderate Sedation:      N/A- Per Anesthesia Care Recommendation:           - Patient has a contact number available for                            emergencies. The signs and symptoms of potential  delayed complications were discussed with the                            patient. Return to normal activities tomorrow.                            Written discharge instructions were provided to the                            patient.                           - Repeat colonoscopy in 10 years for screening                            purposes.                           - Resume previous diet.                           - Continue present medications. Procedure Code(s):        --- Professional ---                           [id number], Colorectal cancer screening; colonoscopy on                            individual not meeting criteria for high risk Diagnosis  Code(s):        --- Professional ---                           Z12.11, Encounter for screening for malignant                            neoplasm of colon CPT copyright 2016 [employer]. All rights reserved. The codes documented in this report are preliminary and upon coder review may  be revised to meet current compliance requirements. Earle Gell, MD Garlan Fair, MD 05/13/2016 8:50:20 AM This report has been signed electronically. Number of Addenda: 0

## 2016-05-14 ENCOUNTER — Encounter (HOSPITAL_COMMUNITY): Payer: Self-pay | Admitting: Gastroenterology

## 2017-01-05 ENCOUNTER — Other Ambulatory Visit: Payer: Self-pay | Admitting: Family Medicine

## 2017-01-05 DIAGNOSIS — Z1231 Encounter for screening mammogram for malignant neoplasm of breast: Secondary | ICD-10-CM

## 2017-02-18 ENCOUNTER — Ambulatory Visit
Admission: RE | Admit: 2017-02-18 | Discharge: 2017-02-18 | Disposition: A | Discharge status: Home / Self Care | Payer: Medicare Other | Source: Ambulatory Visit | Attending: Family Medicine | Admitting: Family Medicine

## 2017-02-18 DIAGNOSIS — Z1231 Encounter for screening mammogram for malignant neoplasm of breast: Secondary | ICD-10-CM

## 2017-03-25 ENCOUNTER — Ambulatory Visit (INDEPENDENT_AMBULATORY_CARE_PROVIDER_SITE_OTHER): Payer: Medicare Other | Admitting: Neurology

## 2017-03-25 ENCOUNTER — Encounter: Payer: Self-pay | Admitting: Neurology

## 2017-03-25 ENCOUNTER — Encounter (INDEPENDENT_AMBULATORY_CARE_PROVIDER_SITE_OTHER): Payer: Self-pay

## 2017-03-25 VITALS — BP 101/75 | HR 83 | Ht 64.0 in | Wt 198.0 lb

## 2017-03-25 DIAGNOSIS — M542 Cervicalgia: Secondary | ICD-10-CM | POA: Insufficient documentation

## 2017-03-25 DIAGNOSIS — G250 Essential tremor: Secondary | ICD-10-CM

## 2017-03-25 DIAGNOSIS — R202 Paresthesia of skin: Secondary | ICD-10-CM | POA: Insufficient documentation

## 2017-03-25 MED ORDER — GABAPENTIN 100 MG PO CAPS: 100.0000 mg | ORAL_CAPSULE | Freq: Three times a day (TID) | ORAL | 2 refills | Status: DC

## 2017-03-25 NOTE — Progress Notes (Signed)
Reason for visit: Right leg numbness and discomfort, tremor, neck pain  Referring physician: Dr. [name] is a 66 y.o. female  History of present illness:  Yolanda Oconnell is a 66 year old right-handed white female with several issues. The patient has noted within the last 6 months that she has had some intermittent tingling involving the right foot with an achy sensation that involves the lower leg and a pulsating sensation that occurs in the right knee area. The patient believes that this discomfort has worsened gradually over time. She has also noted over the last 6-8 months that she has a mild intermittent tremor that involves the head and neck and both upper extremities, left greater than right. The patient will mainly note the tremor when she is resting, not when she is doing things. The tremor does not interfere with her handwriting and once again it is intermittent in nature not present at all times. The patient also reports a one-year history of chronic neck discomfort and pain into the shoulders. She has crepitus in the neck when she turns her head, she denies pain going down the arms to the hands. She denies any weakness of the extremities, she has not had any gait or balance problems and she has not had any falls. She reports that she has some troubles with the bladder, she has had a resuspension procedure within the last year. The patient does have an occasional problem with low back pain, again this is not persistent. The patient may have some intermittent dizziness, she has a prior history of vertigo. She denies any significant problems with headaches but one month ago she had a two-week episode of headache that came up from the back of the neck. In the past she has seen a chiropractor without significant benefit. She denies any family history of tremor, she is sent to this office for further evaluation.  Past Medical History:  Diagnosis Date  . Arthritis    arthritis-knees,  shoulders  . Environmental allergies   . GERD (gastroesophageal reflux disease)   . Hyperlipidemia   . Hypertension   . Hypothyroid   . PONV (postoperative nausea and vomiting)   . Sarcoidosis 2004   tested positive-no active disease    Past Surgical History:  Procedure Laterality Date  . cerivcal polyp removal  10-1999   Dr Phineas Real  . CHOLECYSTECTOMY    . COLONOSCOPY WITH PROPOFOL N/A 05/13/2016   Procedure: COLONOSCOPY WITH PROPOFOL;  Surgeon: Garlan Fair, MD;  Location: [facility];  Service: Endoscopy;  Laterality: N/A;  . herniated disc surgery  07/1994   Dr Roy-cervical  . Laparoscopic Gallbladder Surgery  12-1999   Dr Neldon Mc  . MEDIASTINOSCOPY  2004   granulomatous adenopathy c/w sarcoid  . PUBOVAGINAL SLING N/A 01/16/2015   Procedure: Margarita Grizzle SPARC;  Surgeon: Bjorn Loser, MD;  Location: [facility];  Service: Urology;  Laterality: N/A;  . Right Rotator Cuff Surgery  [phone]; [phone]   Dr Durward Fortes  . Thumb Surgery Right 04/1998   Dr Yates Decamp cracked  . TUBAL LIGATION  12-1976   Dr Manus Rudd    Family History  Problem Relation Age of Onset  . Coronary artery disease Mother   . Diabetes Sister     Social history:  reports that she has never smoked. She has never used smokeless tobacco. She reports that she does not drink alcohol or use drugs.  Medications:  Prior to Admission medications   Medication Sig  Start Date End Date Taking? Authorizing Provider  ALPRAZolam Duanne Moron) 0.5 MG tablet Take 0.5 mg by mouth as needed.   Yes [provider]  ASPIRIN 81 PO Take 1 tablet by mouth daily.   Yes [provider]  Calcium Carbonate-Vitamin D (CALCIUM 600 + D PO) Take 2 capsules by mouth daily.   Yes [provider]  cetirizine (ZYRTEC) 10 MG tablet Take 10 mg by mouth at bedtime.   Yes [provider]  Cholecalciferol (VITAMIN D) 2000 UNITS CAPS Take 1 capsule by mouth daily.   Yes [provider]  FLUoxetine (PROZAC) 40 MG capsule Take 40 mg by mouth daily.   Yes [provider]  levothyroxine (SYNTHROID, LEVOTHROID) 75 MCG tablet Take 75 mcg by mouth daily.   Yes [provider]  Multiple Vitamin (MULTIVITAMIN) tablet Take 1 tablet by mouth daily.   Yes [provider]  niacin 500 MG tablet Take 500 mg by mouth daily with breakfast.   Yes [provider]  omeprazole (PRILOSEC) 40 MG capsule Take 40 mg by mouth daily.   Yes [provider]  rosuvastatin (CRESTOR) 20 MG tablet Take 20 mg by mouth daily.   Yes [provider]  valsartan-hydrochlorothiazide (DIOVAN-HCT) 320-25 MG per tablet Take 1 tablet by mouth daily.   Yes [provider]  zolpidem (AMBIEN) 10 MG tablet Take 5 mg by mouth at bedtime as needed.   Yes [provider]      Allergies  Allergen Reactions  . Compazine Swelling    Tongue and eyes    ROS:  Out of a complete 14 system review of symptoms, the patient complains only of the following symptoms, and all other reviewed systems are negative.  Weight gain, fatigue Hearing loss, ringing in the ears Easy bruising, feeling hot, flushing Joint pain Allergies Numbness, dizziness, tremor Insomnia, snoring  Blood pressure 101/75, pulse 83, height 5\' 4"  (1.626 m), weight 198 lb (89.8 kg).  Physical Exam  General: The patient is alert and cooperative at the time of the examination. The patient is moderately obese.  Eyes: Pupils are equal, round, and reactive to light. Discs are flat bilaterally.  Neck: The neck is supple, no carotid bruits are noted.  Respiratory: The respiratory examination is clear.  Cardiovascular: The cardiovascular examination reveals a regular rate and rhythm, no obvious murmurs or rubs are noted.  Neuromuscular: The patient has full range of movement of the lumbar and cervical spine.  Skin: Extremities are without significant edema.  Neurologic  Exam  Mental status: The patient is alert and oriented x 3 at the time of the examination. The patient has apparent normal recent and remote memory, with an apparently normal attention span and concentration ability.  Cranial nerves: Facial symmetry is present. There is good sensation of the face to pinprick and soft touch bilaterally. The strength of the facial muscles and the muscles to head turning and shoulder shrug are normal bilaterally. Speech is well enunciated, no aphasia or dysarthria is noted. Extraocular movements are full. Visual fields are full. The tongue is midline, and the patient has symmetric elevation of the soft palate. No obvious hearing deficits are noted.  Motor: The motor testing reveals 5 over 5 strength of all 4 extremities. Good symmetric motor tone is noted throughout.  Sensory: Sensory testing is intact to pinprick, soft touch, vibration sensation, and position sense on all 4 extremities. No evidence of extinction is noted.  Coordination: Cerebellar testing reveals good finger-nose-finger  and heel-to-shin bilaterally. No tremor seen. There is no tremor translator into handwriting when drawing a spiral.  Gait and station: Gait is normal. Tandem gait is normal. Romberg is negative. No drift is seen.  Reflexes: Deep tendon reflexes are symmetric and normal bilaterally. Toes are downgoing bilaterally.   Assessment/Plan:  1. Reported tremor  2. Right lower leg numbness and discomfort  3. Chronic neck pain  The patient reports a tremor problem, this is not observed on clinical examination today, the patient indicates that this is intermittent in nature. The patient also has intermittent numbness and discomfort of the right lower leg. The patient will be set up for nerve conduction studies of both legs and EMG on the right leg. The patient does not report a lot of low back pain. She does report chronic neck and shoulder discomfort, she does not relate this pain to when  she started her cholesterol medications. She will be placed on low-dose gabapentin for the tremor and for the neuromuscular pain, starting at 100 mg 3 times daily. She will call for any dose adjustments. She will follow-up in 4 months, she will also follow-up for the EMG evaluation. Physical therapy for the cervical spine in the future may be of some benefit.  Jill Alexanders MD 03/25/2017 9:05 AM  [facility] [address] [place], [place] [address]  Phone [phone] Fax [phone]

## 2017-03-25 NOTE — Patient Instructions (Signed)
   We will start low dose gabapentin for the tremor and the neck pain. Call for any dose adjustments.  Neurontin (gabapentin) may result in drowsiness, ankle swelling, gait instability, or possibly dizziness. Please contact our office if significant side effects occur with this medication.   We will get EMG and NCV evaluation to look at nerve function of the right leg.

## 2017-04-01 ENCOUNTER — Emergency Department (HOSPITAL_COMMUNITY): Payer: Medicare Other

## 2017-04-01 ENCOUNTER — Encounter (HOSPITAL_COMMUNITY): Payer: Self-pay

## 2017-04-01 ENCOUNTER — Observation Stay (HOSPITAL_COMMUNITY)
Admission: EM | Admit: 2017-04-01 | Discharge: 2017-04-03 | Disposition: A | Discharge status: Home / Self Care | Payer: Medicare Other | Attending: Internal Medicine | Admitting: Internal Medicine

## 2017-04-01 ENCOUNTER — Encounter: Payer: Medicare Other | Admitting: Neurology

## 2017-04-01 DIAGNOSIS — I129 Hypertensive chronic kidney disease with stage 1 through stage 4 chronic kidney disease, or unspecified chronic kidney disease: Secondary | ICD-10-CM | POA: Diagnosis not present

## 2017-04-01 DIAGNOSIS — E039 Hypothyroidism, unspecified: Secondary | ICD-10-CM | POA: Diagnosis not present

## 2017-04-01 DIAGNOSIS — R748 Abnormal levels of other serum enzymes: Secondary | ICD-10-CM | POA: Insufficient documentation

## 2017-04-01 DIAGNOSIS — R739 Hyperglycemia, unspecified: Secondary | ICD-10-CM | POA: Diagnosis present

## 2017-04-01 DIAGNOSIS — R202 Paresthesia of skin: Secondary | ICD-10-CM | POA: Diagnosis present

## 2017-04-01 DIAGNOSIS — R740 Nonspecific elevation of levels of transaminase and lactic acid dehydrogenase [LDH]: Secondary | ICD-10-CM | POA: Diagnosis not present

## 2017-04-01 DIAGNOSIS — N183 Chronic kidney disease, stage 3 unspecified: Secondary | ICD-10-CM | POA: Diagnosis present

## 2017-04-01 DIAGNOSIS — Z79899 Other long term (current) drug therapy: Secondary | ICD-10-CM | POA: Diagnosis not present

## 2017-04-01 DIAGNOSIS — R17 Unspecified jaundice: Secondary | ICD-10-CM | POA: Diagnosis not present

## 2017-04-01 DIAGNOSIS — K219 Gastro-esophageal reflux disease without esophagitis: Secondary | ICD-10-CM | POA: Insufficient documentation

## 2017-04-01 DIAGNOSIS — E785 Hyperlipidemia, unspecified: Secondary | ICD-10-CM | POA: Diagnosis not present

## 2017-04-01 DIAGNOSIS — R531 Weakness: Secondary | ICD-10-CM | POA: Diagnosis present

## 2017-04-01 DIAGNOSIS — R42 Dizziness and giddiness: Secondary | ICD-10-CM | POA: Diagnosis not present

## 2017-04-01 DIAGNOSIS — G629 Polyneuropathy, unspecified: Secondary | ICD-10-CM | POA: Insufficient documentation

## 2017-04-01 DIAGNOSIS — I10 Essential (primary) hypertension: Secondary | ICD-10-CM | POA: Diagnosis present

## 2017-04-01 DIAGNOSIS — R7401 Elevation of levels of liver transaminase levels: Secondary | ICD-10-CM | POA: Diagnosis present

## 2017-04-01 DIAGNOSIS — B179 Acute viral hepatitis, unspecified: Principal | ICD-10-CM | POA: Insufficient documentation

## 2017-04-01 HISTORY — DX: Unspecified symptoms and signs involving the nervous system: R29.90

## 2017-04-01 LAB — CBC
HCT: 41 % (ref 36.0–46.0)
HEMOGLOBIN: 13.3 g/dL (ref 12.0–15.0)
MCH: 28.7 pg (ref 26.0–34.0)
MCHC: 32.4 g/dL (ref 30.0–36.0)
MCV: 88.4 fL (ref 78.0–100.0)
PLATELETS: 290 10*3/uL (ref 150–400)
RBC: 4.64 MIL/uL (ref 3.87–5.11)
RDW: 16.1 % — ABNORMAL HIGH (ref 11.5–15.5)
WBC: 5.6 10*3/uL (ref 4.0–10.5)

## 2017-04-01 LAB — COMPREHENSIVE METABOLIC PANEL
ALBUMIN: 3.6 g/dL (ref 3.5–5.0)
ALT: 1777 U/L — ABNORMAL HIGH (ref 14–54)
AST: 1024 U/L — ABNORMAL HIGH (ref 15–41)
Alkaline Phosphatase: 150 U/L — ABNORMAL HIGH (ref 38–126)
Anion gap: 8 (ref 5–15)
BUN: 11 mg/dL (ref 6–20)
CHLORIDE: 100 mmol/L — AB (ref 101–111)
CO2: 26 mmol/L (ref 22–32)
Calcium: 10.5 mg/dL — ABNORMAL HIGH (ref 8.9–10.3)
Creatinine, Ser: 1.14 mg/dL — ABNORMAL HIGH (ref 0.44–1.00)
GFR calc Af Amer: 56 mL/min — ABNORMAL LOW (ref >60)
GFR, EST NON AFRICAN AMERICAN: 49 mL/min — AB (ref >60)
Glucose, Bld: 120 mg/dL — ABNORMAL HIGH (ref 65–99)
POTASSIUM: 4.1 mmol/L (ref 3.5–5.1)
SODIUM: 134 mmol/L — AB (ref 135–145)
Total Bilirubin: 7.6 mg/dL — ABNORMAL HIGH (ref 0.3–1.2)
Total Protein: 8.3 g/dL — ABNORMAL HIGH (ref 6.5–8.1)

## 2017-04-01 LAB — URINALYSIS, ROUTINE W REFLEX MICROSCOPIC
Bilirubin Urine: NEGATIVE
Glucose, UA: NEGATIVE mg/dL
Hgb urine dipstick: NEGATIVE
Ketones, ur: NEGATIVE mg/dL
LEUKOCYTES UA: NEGATIVE
NITRITE: NEGATIVE
PROTEIN: NEGATIVE mg/dL
SPECIFIC GRAVITY, URINE: 1.023 (ref 1.005–1.030)
pH: 7 (ref 5.0–8.0)

## 2017-04-01 LAB — PROTIME-INR
INR: 1.17
PROTHROMBIN TIME: 14.9 s (ref 11.4–15.2)

## 2017-04-01 LAB — ACETAMINOPHEN LEVEL: Acetaminophen (Tylenol), Serum: <10 ug/mL — ABNORMAL LOW (ref 10–30)

## 2017-04-01 LAB — AMMONIA: AMMONIA: 48 umol/L — AB (ref 9–35)

## 2017-04-01 LAB — LIPASE, BLOOD: LIPASE: 68 U/L — AB (ref 11–51)

## 2017-04-01 MED ORDER — IOPAMIDOL (ISOVUE-300) INJECTION 61%
INTRAVENOUS | Status: AC
  Administered 2017-04-01: 100 mL
  Filled 2017-04-01: qty 100

## 2017-04-01 MED ORDER — SODIUM CHLORIDE 0.9 % IV BOLUS (SEPSIS)
1000.0000 mL | Freq: Once | INTRAVENOUS | Status: AC
  Administered 2017-04-01: 1000 mL via INTRAVENOUS

## 2017-04-01 MED ORDER — LEVOTHYROXINE SODIUM 75 MCG PO TABS
75.0000 ug | ORAL_TABLET | Freq: Every day | ORAL | Status: DC
  Administered 2017-04-02 – 2017-04-03 (×2): 75 ug via ORAL
  Filled 2017-04-01 (×2): qty 1

## 2017-04-01 MED ORDER — ALPRAZOLAM 0.5 MG PO TABS: 0.5000 mg | ORAL_TABLET | ORAL | Status: DC | PRN

## 2017-04-01 MED ORDER — KETOROLAC TROMETHAMINE 15 MG/ML IJ SOLN: 15.0000 mg | Freq: Four times a day (QID) | INTRAMUSCULAR | Status: DC | PRN

## 2017-04-01 MED ORDER — ASPIRIN 81 MG PO CHEW
81.0000 mg | CHEWABLE_TABLET | Freq: Every day | ORAL | Status: DC
  Administered 2017-04-02 – 2017-04-03 (×2): 81 mg via ORAL
  Filled 2017-04-01 (×2): qty 1

## 2017-04-01 MED ORDER — ONDANSETRON HCL 4 MG/2ML IJ SOLN: 4.0000 mg | Freq: Four times a day (QID) | INTRAMUSCULAR | Status: DC | PRN

## 2017-04-01 MED ORDER — ENOXAPARIN SODIUM 40 MG/0.4ML ~~LOC~~ SOLN
40.0000 mg | SUBCUTANEOUS | Status: DC
  Administered 2017-04-01 – 2017-04-02 (×2): 40 mg via SUBCUTANEOUS
  Filled 2017-04-01 (×2): qty 0.4

## 2017-04-01 MED ORDER — OXYCODONE HCL 5 MG PO TABS
5.0000 mg | ORAL_TABLET | ORAL | Status: DC | PRN
  Administered 2017-04-02: 5 mg via ORAL
  Filled 2017-04-01: qty 1

## 2017-04-01 MED ORDER — FLUOXETINE HCL 20 MG PO CAPS
40.0000 mg | ORAL_CAPSULE | Freq: Every day | ORAL | Status: DC
  Administered 2017-04-02: 40 mg via ORAL
  Filled 2017-04-01: qty 2

## 2017-04-01 MED ORDER — PANTOPRAZOLE SODIUM 40 MG PO TBEC
40.0000 mg | DELAYED_RELEASE_TABLET | Freq: Every day | ORAL | Status: DC
  Administered 2017-04-02: 40 mg via ORAL
  Filled 2017-04-01: qty 1

## 2017-04-01 MED ORDER — SODIUM CHLORIDE 0.9 % IV SOLN
INTRAVENOUS | Status: DC
  Administered 2017-04-01: 22:00:00 via INTRAVENOUS

## 2017-04-01 MED ORDER — DOCUSATE SODIUM 100 MG PO CAPS
100.0000 mg | ORAL_CAPSULE | Freq: Two times a day (BID) | ORAL | Status: DC
  Administered 2017-04-01 – 2017-04-03 (×4): 100 mg via ORAL
  Filled 2017-04-01 (×4): qty 1

## 2017-04-01 MED ORDER — ONDANSETRON HCL 4 MG PO TABS: 4.0000 mg | ORAL_TABLET | Freq: Four times a day (QID) | ORAL | Status: DC | PRN

## 2017-04-01 MED ORDER — ZOLPIDEM TARTRATE 5 MG PO TABS
5.0000 mg | ORAL_TABLET | Freq: Every evening | ORAL | Status: DC | PRN
  Administered 2017-04-01 – 2017-04-02 (×2): 5 mg via ORAL
  Filled 2017-04-01 (×2): qty 1

## 2017-04-01 MED ORDER — ONDANSETRON HCL 4 MG/2ML IJ SOLN
4.0000 mg | Freq: Once | INTRAMUSCULAR | Status: AC
  Administered 2017-04-01: 4 mg via INTRAVENOUS
  Filled 2017-04-01: qty 2

## 2017-04-01 NOTE — ED Triage Notes (Signed)
Pt presents from PCP for evaluation of abnormal liver enzymes and possible acute hepatitis. Pt reports ongoing dizziness and nausea. Pt AxO x4.

## 2017-04-01 NOTE — ED Provider Notes (Signed)
[facility] DEPT Provider Note   CSN: [id number] Arrival date & time: 04/01/17  1317     History   Chief Complaint Chief Complaint  Patient presents with  . Abnormal Lab  . Dizziness    HPI   Blood pressure 94/69, pulse 87, temperature 97.9 F (36.6 C), temperature source Oral, resp. rate 16, SpO2 95 %.  Yolanda Oconnell is a 66 y.o. female with past medical history significant for hypertension, hyperlipidemia and sarcoid positive testing sent by primary care for evaluation of transaminitis. Patient states that she has been feeling generally weak, lightheaded and she started vomiting this morning. She was seen by primary care and found to have a elevated LFT. She also states that her urine has been much darker than normal with no dysuria, urinary frequency or urgency. She denies any history of liver problems, heavy acetaminophen use, alcohol use, recent travel, recent sick contacts. She does endorse recent easy bleeding without easy bruising. Family states that she does appear jaundiced. Denies confusion or pruritus. Endorses left upper quadrant discomfort on review of systems, she states that this has been going on for some time and she attributed it to a muscle spasm. She states that she thinks her gastroenterologist performed her colonoscopy was at [facility].    Past Medical History:  Diagnosis Date  . Arthritis    arthritis-knees, shoulders  . Environmental allergies   . GERD (gastroesophageal reflux disease)   . Hyperlipidemia   . Hypertension   . Hypothyroid   . PONV (postoperative nausea and vomiting)   . Sarcoidosis 2004   tested positive-no active disease    Patient Active Problem List   Diagnosis Date Noted  . Neck pain 03/25/2017  . Paresthesia 03/25/2017  . Tremor, essential 03/25/2017  . Cough 12/21/2011    Past Surgical History:  Procedure Laterality Date  . cerivcal polyp removal  10-1999   Dr Phineas Real  . CHOLECYSTECTOMY    . COLONOSCOPY  WITH PROPOFOL N/A 05/13/2016   Procedure: COLONOSCOPY WITH PROPOFOL;  Surgeon: Garlan Fair, MD;  Location: [facility];  Service: Endoscopy;  Laterality: N/A;  . herniated disc surgery  07/1994   Dr Roy-cervical  . Laparoscopic Gallbladder Surgery  12-1999   Dr Neldon Mc  . MEDIASTINOSCOPY  2004   granulomatous adenopathy c/w sarcoid  . PUBOVAGINAL SLING N/A 01/16/2015   Procedure: Margarita Grizzle SPARC;  Surgeon: Bjorn Loser, MD;  Location: [facility];  Service: Urology;  Laterality: N/A;  . Right Rotator Cuff Surgery  [phone]; [phone]   Dr Durward Fortes  . Thumb Surgery Right 04/1998   Dr Yates Decamp cracked  . TUBAL LIGATION  12-1976   Dr Manus Rudd    OB History    No data available       Home Medications    Prior to Admission medications   Medication Sig Start Date End Date Taking? Authorizing Provider  acetaminophen (TYLENOL) 500 MG tablet Take 1,000 mg by mouth every 6 (six) hours as needed for mild pain.   Yes [provider]  alendronate (FOSAMAX) 70 MG tablet Take 70 mg by mouth once a week. 02/10/17  Yes [provider]  ALPRAZolam Duanne Moron) 0.5 MG tablet Take 0.5 mg by mouth as needed for anxiety.    Yes [provider]  ASPIRIN 81 PO Take 1 tablet by mouth daily.   Yes [provider]  Calcium Carbonate-Vitamin D (CALCIUM 600 + D PO) Take 2 capsules by mouth daily.   Yes [provider]  cetirizine (ZYRTEC) 10 MG tablet Take 10 mg by mouth at bedtime.   Yes [provider]  Cholecalciferol (VITAMIN D) 2000 UNITS CAPS Take 1 capsule by mouth daily.   Yes [provider]  esomeprazole (NEXIUM) 40 MG capsule Take 40 mg by mouth daily. 02/10/17  Yes [provider]  FLUoxetine (PROZAC) 40 MG capsule Take 40 mg by mouth daily.   Yes [provider]  gabapentin (NEURONTIN) 100 MG capsule Take 1 capsule (100 mg total) by mouth 3 (three) times daily. 03/25/17  Yes Kathrynn Ducking, MD  levothyroxine (SYNTHROID, LEVOTHROID) 75 MCG tablet Take 75 mcg by mouth daily.   Yes [provider]  Multiple Vitamin (MULTIVITAMIN) tablet Take 1 tablet by mouth daily.   Yes [provider]  niacin 500 MG tablet Take 500 mg by mouth daily with breakfast.   Yes [provider]  rosuvastatin (CRESTOR) 20 MG tablet Take 20 mg by mouth daily.   Yes [provider]  valsartan-hydrochlorothiazide (DIOVAN-HCT) 320-25 MG per tablet Take 1 tablet by mouth daily.   Yes [provider]  zolpidem (AMBIEN) 10 MG tablet Take 10 mg by mouth at bedtime as needed for sleep.    Yes [provider]    Family History Family History  Problem Relation Age of Onset  . Coronary artery disease Mother   . Diabetes Sister     Social History Social History  Substance Use Topics  . Smoking status: Never Smoker  . Smokeless tobacco: Never Used  . Alcohol use No     Allergies   Compazine   Review of Systems Review of Systems  A complete review of systems was obtained and all systems are negative except as noted in the HPI and PMH.   Physical Exam Updated Vital Signs BP 112/74   Pulse 69   Temp 97.9 F (36.6 C) (Oral)   Resp 18   SpO2 95%   Physical Exam  Constitutional: She is oriented to person, place, and time. She appears well-developed and well-nourished. No distress.  HENT:  Head: Normocephalic and atraumatic.  Mouth/Throat: Oropharynx is clear and moist.  Eyes: Pupils are equal, round, and reactive to light. Conjunctivae and EOM are normal. Scleral icterus is present.  Neck: Normal range of motion.  Cardiovascular: Normal rate, regular rhythm and intact distal pulses.   Pulmonary/Chest: Effort normal and breath sounds normal.  Abdominal: Soft. There is no tenderness.  Musculoskeletal: Normal range of motion.  Neurological: She is alert and oriented to person, place, and time.  Skin: Capillary refill takes less than 2  seconds. She is not diaphoretic.  Psychiatric: She has a normal mood and affect.  Nursing note and vitals reviewed.    ED Treatments / Results  Labs (all labs ordered are listed, but only abnormal results are displayed) Labs Reviewed  CBC - Abnormal; Notable for the following:       Result Value   RDW 16.1 (*)    All other components within normal limits  LIPASE, BLOOD - Abnormal; Notable for the following:    Lipase 68 (*)    All other components within normal limits  COMPREHENSIVE METABOLIC PANEL - Abnormal; Notable for the following:    Sodium 134 (*)    Chloride 100 (*)    Glucose, Bld 120 (*)    Creatinine, Ser 1.14 (*)    Calcium 10.5 (*)    Total Protein 8.3 (*)    AST 1,024 (*)  ALT 1,777 (*)    Alkaline Phosphatase 150 (*)    Total Bilirubin 7.6 (*)    GFR calc non Af Amer 49 (*)    GFR calc Af Amer 56 (*)    All other components within normal limits  ACETAMINOPHEN LEVEL - Abnormal; Notable for the following:    Acetaminophen (Tylenol), Serum <10 (*)    All other components within normal limits  AMMONIA - Abnormal; Notable for the following:    Ammonia 48 (*)    All other components within normal limits  PROTIME-INR  URINALYSIS, ROUTINE W REFLEX MICROSCOPIC  HEPATITIS PANEL, ACUTE    EKG  EKG Interpretation  Date/Time:  Wednesday April 01 2017 13:48:51 EDT Ventricular Rate:  86 PR Interval:  168 QRS Duration: 92 QT Interval:  372 QTC Calculation: 445 R Axis:   -49 Text Interpretation:  Normal sinus rhythm Left anterior fascicular block Cannot rule out Anterior infarct , age undetermined ST & T wave abnormality, consider inferior ischemia Abnormal ECG No significant change since last tracing Confirmed by Wandra Arthurs [phone]) on 04/01/2017 4:04:16 PM       Radiology Ct Abdomen Pelvis W Contrast  Result Date: 04/01/2017 CLINICAL DATA:  66 year old female with transaminitis and possible acute hepatitis EXAM: CT ABDOMEN AND PELVIS WITH CONTRAST  TECHNIQUE: Multidetector CT imaging of the abdomen and pelvis was performed using the standard protocol following bolus administration of intravenous contrast. CONTRAST:  100 mL Isovue 300 % COMPARISON:  Prior CT abdomen/ pelvis 03/15/2003 FINDINGS: Lower chest: Mild dependent atelectasis in the lower lobes. The heart is at the upper limits of normal for size. No pericardial effusion. Small hiatal hernia. No acute abnormality. Hepatobiliary: No focal liver abnormality is seen. Status post cholecystectomy. No biliary dilatation. Pancreas: Unremarkable. No pancreatic ductal dilatation or surrounding inflammatory changes. Spleen: Normal in size without focal abnormality. Adrenals/Urinary Tract: Normal adrenal glands. Symmetric renal parenchymal enhancement bilaterally. No enhancing mass, nephrolithiasis or hydronephrosis. Two simple cysts are noted in the left upper and interpolar kidney. Unremarkable ureters and bladder. Stomach/Bowel: No evidence of obstruction or focal bowel wall thickening. Normal appendix in the right lower quadrant. The terminal ileum is unremarkable. Vascular/Lymphatic: Minimal atherosclerotic calcifications of the about the aorta. No evidence of aneurysm. The hepatic and portal veins are patent. No suspicious lymphadenopathy. Reproductive: Uterus and bilateral adnexa are unremarkable. Other: No abdominal wall hernia or abnormality. No abdominopelvic ascites. Musculoskeletal: No acute fracture or aggressive appearing lytic or blastic osseous lesion. IMPRESSION: 1. No acute abnormality in the abdomen. Specifically, the CT appearance the liver is unremarkable save for surgical changes of prior cholecystectomy. 2. Borderline cardiomegaly. 3. Simple left side renal cysts. 4.  Aortic Atherosclerosis (ICD10-170.0) Electronically Signed   By: Jacqulynn Cadet M.D.   On: 04/01/2017 18:31    Procedures Procedures (including critical care time)  Medications Ordered in ED Medications  ondansetron  (ZOFRAN) injection 4 mg (not administered)  sodium chloride 0.9 % bolus 1,000 mL (0 mLs Intravenous Stopped 04/01/17 1859)  iopamidol (ISOVUE-300) 61 % injection (100 mLs  Contrast Given 04/01/17 1757)     Initial Impression / Assessment and Plan / ED Course  I have reviewed the triage vital signs and the nursing notes.  Pertinent labs & imaging results that were available during my care of the patient were reviewed by me and considered in my medical decision making (see chart for details).     Vitals:   04/01/17 1800 04/01/17 1815 04/01/17 1830 04/01/17 1845  BP:  112/74    Pulse: 67 68 71 69  Resp: 16 16 13 18   Temp:      TempSrc:      SpO2: 98% 97% 95% 95%    Medications  ondansetron (ZOFRAN) injection 4 mg (not administered)  sodium chloride 0.9 % bolus 1,000 mL (0 mLs Intravenous Stopped 04/01/17 1859)  iopamidol (ISOVUE-300) 61 % injection (100 mLs  Contrast Given 0/17/49 4496)    DEION FORGUE is 66 y.o. female presenting with Dizziness and nausea worsening over the course of several weeks. Primary care noticed a significant transaminitis. Patient is mildly jaundiced, there is no true pain with her jaundice. Acute hepatitis panel, INR, ammonia and CT for possible obstructing mass.  INR normal, ammonia is not significantly elevated, CT shows no abnormality normal biliary ducts status post cholecystectomy.  Gastroenterology consult from Dr. Amedeo Plenty appreciated they would not recommend adding on any other testing at this time, they will provide formal consult and await the results of the acute hepatitis panel.  Patient admitted to Dr. Lorin Mercy.    Final Clinical Impressions(s) / ED Diagnoses   Final diagnoses:  Transaminitis  Jaundice    New Prescriptions New Prescriptions   No medications on file     Karen Kays Charna Elizabeth 04/01/17 1916    Drenda Freeze, MD 04/02/17 [phone]

## 2017-04-01 NOTE — H&P (Signed)
History and Physical    Yolanda Oconnell:[id number] DOB: 09-Jan-1951 DOA: 04/01/2017  PCP: Carol Ada, MD Consultants:  Jannifer Franklin - neurology - scheduled for nerve conduction test Monday; [facility] - orthopedics; [facility] - last colonoscopy 5/18 Patient coming from: Home - lives with husband; NOK: husband, [phone]  Chief Complaint: abnormal lab  HPI: Yolanda Oconnell is a 66 y.o. female with medical history significant of hypothyroidism; HTN; HLD; and an undiagnosed neurological issue involving her right arm and leg presenting with LFT elevation.  Patient started about Friday with dark urine, feelling very drained.  She developed fever to 99.  Also noticed dizziness.  Today, the dizziness was more like vertigo, but previously it caused ataxia.  She called her PCP yesterday and she did blood work and planned to probably do a scan on the liver.  Today, she developed vomiting and Dr. Tamala Julian told her to come to the ER.  Temp was never known to be higher than 99.  She has been somewhat nauseated the last few days but vomiting only started today.  She did not see blood in the emesis.  Somewhat loose stools but no diarrhea.  She ate at [facility] for a meal Wednesday and Thursday.  Friday she went to a BBQ place on [address].  She does not remember anything tasting funny.  No one else has gotten sick, even though they ate the same things.  No other risky behaviors including new sexual relations, drugs, etc.   ED Course: Marked LFT elevation with jaundice; acute hepatitis panel is pending; CT negative with normal biliary ducts and s/p cholecystectomy.  Normal INR.  GI to see in AM.  Review of Systems: As per HPI; otherwise  review of systems reviewed and negative.   Ambulatory Status:  Ambulates without assistance  Past Medical History:  Diagnosis Date  . Arthritis    arthritis-knees, shoulders  . Environmental allergies   . GERD (gastroesophageal reflux disease)   . Hyperlipidemia   . Hypertension   .  Hypothyroid   . Neurological symptoms    R-sided pain/numbness/tingling/cold sensation in foot; arm and leg pain periodically - she is seeing a neurologist for this  . PONV (postoperative nausea and vomiting)   . Sarcoidosis 2004   tested positive-no active disease    Past Surgical History:  Procedure Laterality Date  . cerivcal polyp removal  10-1999   Dr Phineas Real  . COLONOSCOPY WITH PROPOFOL N/A 05/13/2016   Procedure: COLONOSCOPY WITH PROPOFOL;  Surgeon: Garlan Fair, MD;  Location: [facility];  Service: Endoscopy;  Laterality: N/A;  . herniated disc surgery  07/1994   Dr Roy-cervical  . Laparoscopic Gallbladder Surgery  12-1999   Dr Neldon Mc  . MEDIASTINOSCOPY  2004   granulomatous adenopathy c/w sarcoid  . PUBOVAGINAL SLING N/A 01/16/2015   Procedure: Margarita Grizzle SPARC;  Surgeon: Bjorn Loser, MD;  Location: [facility];  Service: Urology;  Laterality: N/A;  . Right Rotator Cuff Surgery  [id number]; [phone]   Dr Durward Fortes  . Thumb Surgery Right 04/1998   Dr Cabela Pacifico Decamp cracked  . TUBAL LIGATION  12-1976   Dr Manus Rudd    Social History   Social History  . Marital status: Married    Spouse name: Elenore Rota  . Number of children: 2  . Years of education: 12   Occupational History  . retired    Social History Main Topics  . Smoking status: Never Smoker  . Smokeless tobacco: Never Used  . Alcohol  use No  . Drug use: No  . Sexual activity: Yes   Other Topics Concern  . Not on file   Social History Narrative   Right handed    Drinks tea once daily   Soda/coffee sometimes   Lives with husband    Allergies  Allergen Reactions  . Compazine Swelling    Tongue and eyes    Family History  Problem Relation Age of Onset  . Coronary artery disease Mother   . Diabetes Sister     Prior to Admission medications   Medication Sig Start Date End Date Taking? Authorizing Provider  acetaminophen (TYLENOL) 500 MG tablet Take 1,000 mg by mouth  every 6 (six) hours as needed for mild pain.   Yes [provider]  alendronate (FOSAMAX) 70 MG tablet Take 70 mg by mouth once a week. 02/10/17  Yes [provider]  ALPRAZolam Duanne Moron) 0.5 MG tablet Take 0.5 mg by mouth as needed for anxiety.    Yes [provider]  ASPIRIN 81 PO Take 1 tablet by mouth daily.   Yes [provider]  Calcium Carbonate-Vitamin D (CALCIUM 600 + D PO) Take 2 capsules by mouth daily.   Yes [provider]  cetirizine (ZYRTEC) 10 MG tablet Take 10 mg by mouth at bedtime.   Yes [provider]  Cholecalciferol (VITAMIN D) 2000 UNITS CAPS Take 1 capsule by mouth daily.   Yes [provider]  esomeprazole (NEXIUM) 40 MG capsule Take 40 mg by mouth daily. 02/10/17  Yes [provider]  FLUoxetine (PROZAC) 40 MG capsule Take 40 mg by mouth daily.   Yes [provider]  gabapentin (NEURONTIN) 100 MG capsule Take 1 capsule (100 mg total) by mouth 3 (three) times daily. 03/25/17  Yes Kathrynn Ducking, MD  levothyroxine (SYNTHROID, LEVOTHROID) 75 MCG tablet Take 75 mcg by mouth daily.   Yes [provider]  Multiple Vitamin (MULTIVITAMIN) tablet Take 1 tablet by mouth daily.   Yes [provider]  niacin 500 MG tablet Take 500 mg by mouth daily with breakfast.   Yes [provider]  rosuvastatin (CRESTOR) 20 MG tablet Take 20 mg by mouth daily.   Yes [provider]  valsartan-hydrochlorothiazide (DIOVAN-HCT) 320-25 MG per tablet Take 1 tablet by mouth daily.   Yes [provider]  zolpidem (AMBIEN) 10 MG tablet Take 10 mg by mouth at bedtime as needed for sleep.    Yes [provider]    Physical Exam: Vitals:   04/01/17 1845 04/01/17 1920 04/01/17 1945 04/01/17 2135  BP:  105/84 120/69 116/69  Pulse: 69 65 71 68  Resp: 18 15  18   Temp:    98.2 F (36.8 C)  TempSrc:    Oral  SpO2: 95% 97% 95% 97%  Weight:    91.2 kg (201 lb 1 oz)  Height:     5\' 4"  (1.626 m)     General:  Appears calm and comfortable and is NAD; she appears tan but reports that her usual skin tone is pale Eyes:  PERRL, EOMI, normal lids, iris; +scleral icterus ENT:  grossly normal hearing, lips & tongue, mmm Neck:  no LAD, masses or thyromegaly Cardiovascular:  RRR, no m/r/g. No LE edema.  Respiratory:  CTA bilaterally, no w/r/r. Normal respiratory effort. Abdomen:  soft, ntnd, NABS Skin:  no rash or induration seen on limited exam Musculoskeletal:  grossly normal tone BUE/BLE, good ROM, no bony abnormality Psychiatric:  grossly normal  mood and affect, speech fluent and appropriate, AOx3 Neurologic:  CN 2-12 grossly intact, moves all extremities in coordinated fashion, sensation intact  Labs on Admission: I have personally reviewed following labs and imaging studies  CBC:  Recent Labs Lab 04/01/17 1400  WBC 5.6  HGB 13.3  HCT 41.0  MCV 88.4  PLT 161   Basic Metabolic Panel:  Recent Labs Lab 04/01/17 1355  NA 134*  K 4.1  CL 100*  CO2 26  GLUCOSE 120*  BUN 11  CREATININE 1.14*  CALCIUM 10.5*   GFR: Estimated Creatinine Clearance: 52.4 mL/min (A) (by C-G formula based on SCr of 1.14 mg/dL (H)). Liver Function Tests:  Recent Labs Lab 04/01/17 1355  AST 1,024*  ALT 1,777*  ALKPHOS 150*  BILITOT 7.6*  PROT 8.3*  ALBUMIN 3.6    Recent Labs Lab 04/01/17 1400  LIPASE 68*    Recent Labs Lab 04/01/17 1600  AMMONIA 48*   Coagulation Profile:  Recent Labs Lab 04/01/17 1600  INR 1.17   Cardiac Enzymes: No results for input(s): CKTOTAL, CKMB, CKMBINDEX, TROPONINI in the last 168 hours. BNP (last 3 results) No results for input(s): PROBNP in the last 8760 hours. HbA1C: No results for input(s): HGBA1C in the last 72 hours. CBG: No results for input(s): GLUCAP in the last 168 hours. Lipid Profile: No results for input(s): CHOL, HDL, LDLCALC, TRIG, CHOLHDL, LDLDIRECT in the last 72 hours. Thyroid Function Tests: No  results for input(s): TSH, T4TOTAL, FREET4, T3FREE, THYROIDAB in the last 72 hours. Anemia Panel: No results for input(s): VITAMINB12, FOLATE, FERRITIN, TIBC, IRON, RETICCTPCT in the last 72 hours. Urine analysis:    Component Value Date/Time   COLORURINE YELLOW 04/01/2017 1902   APPEARANCEUR CLEAR 04/01/2017 1902   LABSPEC 1.023 04/01/2017 1902   PHURINE 7.0 04/01/2017 1902   GLUCOSEU NEGATIVE 04/01/2017 1902   HGBUR NEGATIVE 04/01/2017 1902   BILIRUBINUR NEGATIVE 04/01/2017 1902   KETONESUR NEGATIVE 04/01/2017 1902   PROTEINUR NEGATIVE 04/01/2017 1902   NITRITE NEGATIVE 04/01/2017 1902   LEUKOCYTESUR NEGATIVE 04/01/2017 1902    Creatinine Clearance: Estimated Creatinine Clearance: 52.4 mL/min (A) (by C-G formula based on SCr of 1.14 mg/dL (H)).  Sepsis Labs: @LABRCNTIP (procalcitonin:4,lacticidven:4) )No results found for this or any previous visit (from the past 240 hour(s)).   Radiological Exams on Admission: Ct Abdomen Pelvis W Contrast  Result Date: 04/01/2017 CLINICAL DATA:  66 year old female with transaminitis and possible acute hepatitis EXAM: CT ABDOMEN AND PELVIS WITH CONTRAST TECHNIQUE: Multidetector CT imaging of the abdomen and pelvis was performed using the standard protocol following bolus administration of intravenous contrast. CONTRAST:  100 mL Isovue 300 % COMPARISON:  Prior CT abdomen/ pelvis 03/15/2003 FINDINGS: Lower chest: Mild dependent atelectasis in the lower lobes. The heart is at the upper limits of normal for size. No pericardial effusion. Small hiatal hernia. No acute abnormality. Hepatobiliary: No focal liver abnormality is seen. Status post cholecystectomy. No biliary dilatation. Pancreas: Unremarkable. No pancreatic ductal dilatation or surrounding inflammatory changes. Spleen: Normal in size without focal abnormality. Adrenals/Urinary Tract: Normal adrenal glands. Symmetric renal parenchymal enhancement bilaterally. No enhancing mass, nephrolithiasis or  hydronephrosis. Two simple cysts are noted in the left upper and interpolar kidney. Unremarkable ureters and bladder. Stomach/Bowel: No evidence of obstruction or focal bowel wall thickening. Normal appendix in the right lower quadrant. The terminal ileum is unremarkable. Vascular/Lymphatic: Minimal atherosclerotic calcifications of the about the aorta. No evidence of aneurysm. The hepatic and portal veins are patent. No suspicious lymphadenopathy. Reproductive: Uterus  and bilateral adnexa are unremarkable. Other: No abdominal wall hernia or abnormality. No abdominopelvic ascites. Musculoskeletal: No acute fracture or aggressive appearing lytic or blastic osseous lesion. IMPRESSION: 1. No acute abnormality in the abdomen. Specifically, the CT appearance the liver is unremarkable save for surgical changes of prior cholecystectomy. 2. Borderline cardiomegaly. 3. Simple left side renal cysts. 4.  Aortic Atherosclerosis (ICD10-170.0) Electronically Signed   By: Jacqulynn Cadet M.D.   On: 04/01/2017 18:31    EKG: Independently reviewed.  NSR with rate 86; LAFB, nonspecific ST changes with no evidence of acute ischemia; NSCSLT  Assessment/Plan Principal Problem:   Transaminitis Active Problems:   Paresthesia   Hypothyroidism   Essential hypertension   Hyperlipidemia   Hyperglycemia   CKD (chronic kidney disease) stage 3, GFR 30-59 ml/min   Transaminitis -Patient without significant GI history and with recent (within 1 year colonoscopy presenting with acute/subacute onset of fatigue, n/v, jaundice. -AP 150; 150 yesterday -AST 1024/ALT 1777; 978/1660 yesterday -Bilirubin 7.6; 6.7 yesterday -NH4 48 -Tylenol <10 -Negative UA -The acute onset appears to be most consistent with a viral hepatitis, and the hepatitis panel is pending.   -Medications could be a cause, but this is less likely.  She does take occasional Tylenol but no excessive use.  She is also taking Crestor. -Will hold all potentially  hepatotoxic medications.   -She is s/p cholecystectomy. -CT of the liver/abdomen was negative. -GI will see in the AM but did not offer further recommendations overnight, as per the EDP.  HTN -Hold Diovan-HCT due to borderline-low BP while in the ER -Resume once BP normalizes  Hypothyroidism -Check TSH -Continue Synthroid at current dose for now  HLD -Hold Crestor, as noted above.  Hyperglycemia -Glucose 120 -May be stress response -Will follow with fasting AM labs  CKD -BUN 11/Creatinine 1.14/GFR 49; 15/1.09/50 yesterday; 17/0.99/59 in 5/16 -Stable, will follow  Paresthesia -Likely unrelated, but patient does have an undiagnosed neurologic disorder that is being evaluated.   DVT prophylaxis: Lovenox Code Status:  Full - confirmed with patient Family Communication: None present Disposition Plan:  Home once clinically improved Consults called: GI - to see in AM  Admission status: It is my clinical opinion that referral for OBSERVATION is reasonable and necessary in this patient based on the above information provided. The aforementioned taken together are felt to place the patient at high risk for further clinical deterioration. However it is anticipated that the patient may be medically stable for discharge from the hospital within 24 to 48 hours.     Karmen Bongo MD [facility]  If 7PM-7AM, please contact night-coverage [email or web address] Password [email or web address]  04/02/2017, 12:50 AM

## 2017-04-02 ENCOUNTER — Encounter (HOSPITAL_COMMUNITY): Payer: Self-pay | Admitting: Gastroenterology

## 2017-04-02 DIAGNOSIS — N183 Chronic kidney disease, stage 3 unspecified: Secondary | ICD-10-CM | POA: Diagnosis present

## 2017-04-02 DIAGNOSIS — E785 Hyperlipidemia, unspecified: Secondary | ICD-10-CM | POA: Diagnosis not present

## 2017-04-02 DIAGNOSIS — B179 Acute viral hepatitis, unspecified: Secondary | ICD-10-CM

## 2017-04-02 DIAGNOSIS — E039 Hypothyroidism, unspecified: Secondary | ICD-10-CM

## 2017-04-02 DIAGNOSIS — I10 Essential (primary) hypertension: Secondary | ICD-10-CM | POA: Diagnosis not present

## 2017-04-02 DIAGNOSIS — R17 Unspecified jaundice: Secondary | ICD-10-CM | POA: Diagnosis not present

## 2017-04-02 LAB — BASIC METABOLIC PANEL
Anion gap: 5 (ref 5–15)
BUN: 8 mg/dL (ref 6–20)
CALCIUM: 9.1 mg/dL (ref 8.9–10.3)
CO2: 28 mmol/L (ref 22–32)
CREATININE: 1.13 mg/dL — AB (ref 0.44–1.00)
Chloride: 106 mmol/L (ref 101–111)
GFR calc Af Amer: 57 mL/min — ABNORMAL LOW (ref >60)
GFR, EST NON AFRICAN AMERICAN: 49 mL/min — AB (ref >60)
GLUCOSE: 138 mg/dL — AB (ref 65–99)
Potassium: 4.2 mmol/L (ref 3.5–5.1)
SODIUM: 139 mmol/L (ref 135–145)

## 2017-04-02 LAB — TSH: TSH: 1.403 u[IU]/mL (ref 0.350–4.500)

## 2017-04-02 LAB — HEPATIC FUNCTION PANEL
ALBUMIN: 3 g/dL — AB (ref 3.5–5.0)
ALK PHOS: 112 U/L (ref 38–126)
ALT: 1326 U/L — ABNORMAL HIGH (ref 14–54)
AST: 741 U/L — ABNORMAL HIGH (ref 15–41)
BILIRUBIN INDIRECT: 2.3 mg/dL — AB (ref 0.3–0.9)
Bilirubin, Direct: 3.6 mg/dL — ABNORMAL HIGH (ref 0.1–0.5)
TOTAL PROTEIN: 7.1 g/dL (ref 6.5–8.1)
Total Bilirubin: 5.9 mg/dL — ABNORMAL HIGH (ref 0.3–1.2)

## 2017-04-02 LAB — CBC
HEMATOCRIT: 36.3 % (ref 36.0–46.0)
Hemoglobin: 11.8 g/dL — ABNORMAL LOW (ref 12.0–15.0)
MCH: 28.9 pg (ref 26.0–34.0)
MCHC: 32.5 g/dL (ref 30.0–36.0)
MCV: 89 fL (ref 78.0–100.0)
PLATELETS: 244 10*3/uL (ref 150–400)
RBC: 4.08 MIL/uL (ref 3.87–5.11)
RDW: 16.4 % — AB (ref 11.5–15.5)
WBC: 5.7 10*3/uL (ref 4.0–10.5)

## 2017-04-02 LAB — HEPATITIS PANEL, ACUTE
HEP A IGM: NEGATIVE
HEP B S AG: NEGATIVE
Hep B C IgM: NEGATIVE

## 2017-04-02 NOTE — Consult Note (Signed)
Reason for Consult: Abnormal liver tests Referring Physician: Hospital team  Yolanda Oconnell is an 66 y.o. female.  HPI: Patient seen and examined and case discussed with her father and her hospital computer chart reviewed and our office computer chart reviewed and patient has had fatigue for months but a week ago noticed her urine was dark and about a month ago she was started on Lipitor instead of Crestor and recently took a few doses of Neurontin otherwise she is on no other new medicines and minimizes over-the-counter medicines and liver disease does not run in the family and no obvious autoimmune problems run in the family and she did vomit yesterday and was instructed to come to the hospital and her liver tests are actually lower today than they were in the office and her previous colonoscopy was reviewed and her CT scan was fine and she has no other complaints and wants to eat and go home and there is no iron or copper problems in the family  Past Medical History:  Diagnosis Date  . Arthritis    arthritis-knees, shoulders  . Environmental allergies   . GERD (gastroesophageal reflux disease)   . Hyperlipidemia   . Hypertension   . Hypothyroid   . Neurological symptoms    R-sided pain/numbness/tingling/cold sensation in foot; arm and leg pain periodically - she is seeing a neurologist for this  . PONV (postoperative nausea and vomiting)   . Sarcoidosis 2004   tested positive-no active disease    Past Surgical History:  Procedure Laterality Date  . cerivcal polyp removal  10-1999   Dr Phineas Real  . COLONOSCOPY WITH PROPOFOL N/A 05/13/2016   Procedure: COLONOSCOPY WITH PROPOFOL;  Surgeon: Garlan Fair, MD;  Location: [facility];  Service: Endoscopy;  Laterality: N/A;  . herniated disc surgery  07/1994   Dr Roy-cervical  . Laparoscopic Gallbladder Surgery  12-1999   Dr Neldon Mc  . MEDIASTINOSCOPY  2004   granulomatous adenopathy c/w sarcoid  . PUBOVAGINAL SLING N/A 01/16/2015    Procedure: Margarita Grizzle SPARC;  Surgeon: Bjorn Loser, MD;  Location: [facility];  Service: Urology;  Laterality: N/A;  . Right Rotator Cuff Surgery  [id number]; [phone]   Dr Durward Fortes  . Thumb Surgery Right 04/1998   Dr Yates Decamp cracked  . TUBAL LIGATION  12-1976   Dr Manus Rudd    Family History  Problem Relation Age of Onset  . Coronary artery disease Mother   . Diabetes Sister     Social History:  reports that she has never smoked. She has never used smokeless tobacco. She reports that she does not drink alcohol or use drugs.  Allergies:  Allergies  Allergen Reactions  . Compazine Swelling    Tongue and eyes    Medications: I have reviewed the patient's current medications.  Results for orders placed or performed during the hospital encounter of 04/01/17 (from the past 48 hour(s))  Comprehensive metabolic panel     Status: Abnormal   Collection Time: 04/01/17  1:55 PM  Result Value Ref Range   Sodium 134 (L) 135 - 145 mmol/L   Potassium 4.1 3.5 - 5.1 mmol/L   Chloride 100 (L) 101 - 111 mmol/L   CO2 26 22 - 32 mmol/L   Glucose, Bld 120 (H) 65 - 99 mg/dL   BUN 11 6 - 20 mg/dL   Creatinine, Ser 1.14 (H) 0.44 - 1.00 mg/dL   Calcium 10.5 (H) 8.9 - 10.3 mg/dL   Total Protein 8.3 (  H) 6.5 - 8.1 g/dL   Albumin 3.6 3.5 - 5.0 g/dL   AST 1,024 (H) 15 - 41 U/L   ALT 1,777 (H) 14 - 54 U/L   Alkaline Phosphatase 150 (H) 38 - 126 U/L   Total Bilirubin 7.6 (H) 0.3 - 1.2 mg/dL   GFR calc non Af Amer 49 (L) >60 mL/min   GFR calc Af Amer 56 (L) >60 mL/min    Comment: (NOTE) The eGFR has been calculated using the CKD EPI equation. This calculation has not been validated in all clinical situations. eGFR's persistently <60 mL/min signify possible Chronic Kidney Disease.    Anion gap 8 5 - 15  CBC     Status: Abnormal   Collection Time: 04/01/17  2:00 PM  Result Value Ref Range   WBC 5.6 4.0 - 10.5 K/uL   RBC 4.64 3.87 - 5.11 MIL/uL   Hemoglobin 13.3 12.0 -  15.0 g/dL   HCT 41.0 36.0 - 46.0 %   MCV 88.4 78.0 - 100.0 fL   MCH 28.7 26.0 - 34.0 pg   MCHC 32.4 30.0 - 36.0 g/dL   RDW 16.1 (H) 11.5 - 15.5 %   Platelets 290 150 - 400 K/uL  Lipase, blood     Status: Abnormal   Collection Time: 04/01/17  2:00 PM  Result Value Ref Range   Lipase 68 (H) 11 - 51 U/L  Protime-INR     Status: None   Collection Time: 04/01/17  4:00 PM  Result Value Ref Range   Prothrombin Time 14.9 11.4 - 15.2 seconds   INR 1.17   Acetaminophen level     Status: Abnormal   Collection Time: 04/01/17  4:00 PM  Result Value Ref Range   Acetaminophen (Tylenol), Serum <10 (L) 10 - 30 ug/mL    Comment:        THERAPEUTIC CONCENTRATIONS VARY SIGNIFICANTLY. A RANGE OF 10-30 ug/mL MAY BE AN EFFECTIVE CONCENTRATION FOR MANY PATIENTS. HOWEVER, SOME ARE BEST TREATED AT CONCENTRATIONS OUTSIDE THIS RANGE. ACETAMINOPHEN CONCENTRATIONS >150 ug/mL AT 4 HOURS AFTER INGESTION AND >50 ug/mL AT 12 HOURS AFTER INGESTION ARE OFTEN ASSOCIATED WITH TOXIC REACTIONS.   Hepatitis panel, acute     Status: None   Collection Time: 04/01/17  4:00 PM  Result Value Ref Range   Hepatitis B Surface Ag Negative Negative   HCV Ab <0.1 0.0 - 0.9 s/co ratio    Comment: (NOTE)                                  Negative:     < 0.8                             Indeterminate: 0.8 - 0.9                                  Positive:     > 0.9 The CDC recommends that a positive HCV antibody result be followed up with a HCV Nucleic Acid Amplification test (599774). Performed At: [facility] [place], [place] [id number] Lindon Romp MD [id number]    Hep A IgM Negative Negative   Hep B C IgM Negative Negative  Ammonia     Status: Abnormal   Collection Time: 04/01/17  4:00 PM  Result Value  Ref Range   Ammonia 48 (H) 9 - 35 umol/L  Urinalysis, Routine w reflex microscopic     Status: None   Collection Time: 04/01/17  7:02 PM  Result Value Ref Range   Color, Urine  YELLOW YELLOW   APPearance CLEAR CLEAR   Specific Gravity, Urine 1.023 1.005 - 1.030   pH 7.0 5.0 - 8.0   Glucose, UA NEGATIVE NEGATIVE mg/dL   Hgb urine dipstick NEGATIVE NEGATIVE   Bilirubin Urine NEGATIVE NEGATIVE   Ketones, ur NEGATIVE NEGATIVE mg/dL   Protein, ur NEGATIVE NEGATIVE mg/dL   Nitrite NEGATIVE NEGATIVE   Leukocytes, UA NEGATIVE NEGATIVE  Basic metabolic panel     Status: Abnormal   Collection Time: 04/02/17  1:10 AM  Result Value Ref Range   Sodium 139 135 - 145 mmol/L   Potassium 4.2 3.5 - 5.1 mmol/L   Chloride 106 101 - 111 mmol/L   CO2 28 22 - 32 mmol/L   Glucose, Bld 138 (H) 65 - 99 mg/dL   BUN 8 6 - 20 mg/dL   Creatinine, Ser 1.13 (H) 0.44 - 1.00 mg/dL   Calcium 9.1 8.9 - 10.3 mg/dL   GFR calc non Af Amer 49 (L) >60 mL/min   GFR calc Af Amer 57 (L) >60 mL/min    Comment: (NOTE) The eGFR has been calculated using the CKD EPI equation. This calculation has not been validated in all clinical situations. eGFR's persistently <60 mL/min signify possible Chronic Kidney Disease.    Anion gap 5 5 - 15  CBC     Status: Abnormal   Collection Time: 04/02/17  1:10 AM  Result Value Ref Range   WBC 5.7 4.0 - 10.5 K/uL   RBC 4.08 3.87 - 5.11 MIL/uL   Hemoglobin 11.8 (L) 12.0 - 15.0 g/dL   HCT 36.3 36.0 - 46.0 %   MCV 89.0 78.0 - 100.0 fL   MCH 28.9 26.0 - 34.0 pg   MCHC 32.5 30.0 - 36.0 g/dL   RDW 16.4 (H) 11.5 - 15.5 %   Platelets 244 150 - 400 K/uL  TSH     Status: None   Collection Time: 04/02/17  1:10 AM  Result Value Ref Range   TSH 1.403 0.350 - 4.500 uIU/mL    Comment: Performed by a 3rd Generation assay with a functional sensitivity of <=0.01 uIU/mL.  Hepatic function panel     Status: Abnormal   Collection Time: 04/02/17  7:55 AM  Result Value Ref Range   Total Protein 7.1 6.5 - 8.1 g/dL   Albumin 3.0 (L) 3.5 - 5.0 g/dL   AST 741 (H) 15 - 41 U/L   ALT 1,326 (H) 14 - 54 U/L   Alkaline Phosphatase 112 38 - 126 U/L   Total Bilirubin 5.9 (H) 0.3 -  1.2 mg/dL   Bilirubin, Direct 3.6 (H) 0.1 - 0.5 mg/dL   Indirect Bilirubin 2.3 (H) 0.3 - 0.9 mg/dL    Ct Abdomen Pelvis W Contrast  Result Date: 04/01/2017 CLINICAL DATA:  66 year old female with transaminitis and possible acute hepatitis EXAM: CT ABDOMEN AND PELVIS WITH CONTRAST TECHNIQUE: Multidetector CT imaging of the abdomen and pelvis was performed using the standard protocol following bolus administration of intravenous contrast. CONTRAST:  100 mL Isovue 300 % COMPARISON:  Prior CT abdomen/ pelvis 03/15/2003 FINDINGS: Lower chest: Mild dependent atelectasis in the lower lobes. The heart is at the upper limits of normal for size. No pericardial effusion. Small hiatal hernia. No acute abnormality. Hepatobiliary:  No focal liver abnormality is seen. Status post cholecystectomy. No biliary dilatation. Pancreas: Unremarkable. No pancreatic ductal dilatation or surrounding inflammatory changes. Spleen: Normal in size without focal abnormality. Adrenals/Urinary Tract: Normal adrenal glands. Symmetric renal parenchymal enhancement bilaterally. No enhancing mass, nephrolithiasis or hydronephrosis. Two simple cysts are noted in the left upper and interpolar kidney. Unremarkable ureters and bladder. Stomach/Bowel: No evidence of obstruction or focal bowel wall thickening. Normal appendix in the right lower quadrant. The terminal ileum is unremarkable. Vascular/Lymphatic: Minimal atherosclerotic calcifications of the about the aorta. No evidence of aneurysm. The hepatic and portal veins are patent. No suspicious lymphadenopathy. Reproductive: Uterus and bilateral adnexa are unremarkable. Other: No abdominal wall hernia or abnormality. No abdominopelvic ascites. Musculoskeletal: No acute fracture or aggressive appearing lytic or blastic osseous lesion. IMPRESSION: 1. No acute abnormality in the abdomen. Specifically, the CT appearance the liver is unremarkable save for surgical changes of prior cholecystectomy. 2.  Borderline cardiomegaly. 3. Simple left side renal cysts. 4.  Aortic Atherosclerosis (ICD10-170.0) Electronically Signed   By: Jacqulynn Cadet M.D.   On: 04/01/2017 18:31    ROS negative except above she did notice her stools are lighter for a few days but had not noticed her eyes were yellow although her primary doctor didn't Blood pressure (!) 102/55, pulse 66, temperature 98 F (36.7 C), temperature source Oral, resp. rate 20, height _0  (1.626 m), weight 91.2 kg (201 lb 1 oz), SpO2 95 %. Physical Exam vital signs stable afebrile no acute distress abdomen is soft nontender good bowel sounds labs reviewed acute hepatitis panel negative INR okay platelet count okay BUN and creatinine okay  Assessment/Plan: Abnormal liver tests probably medicine-induced Plan: If tolerates regular diet tonight and tomorrow can probably be managed at home will add extra serologies particularly autoimmune and hold all medicines except for her Synthroid and can probably restart her blood pressure medicine and Nexium next and then in the future if this resolves slowly add on her antidepressant if she still needs it and her osteoporosis medicine probably last but would not use her statin and consider cholestyramine or just using her niacin and will recheck liver tests on Monday and decide follow-up based on those and await extra serologies as aboveMAGOD,Cela Newcom E 04/02/2017, 1:23 PM

## 2017-04-02 NOTE — Progress Notes (Signed)
PROGRESS NOTE   Yolanda Oconnell  BSJ:[id number]    DOB: [birth year]    DOA: 04/01/2017  PCP: Carol Ada, MD   I have briefly reviewed patients previous medical records in [facility].  Brief Narrative:  66 year old female with PMH of GERD, HLD (changed back from atorvastatin to Crestor 2 months back. Had been on Crestor in the past), HTN, hypothyroid, vertigo,? Right lower extremity peripheral neuropathy for which she took 2 doses of gabapentin approximately 2 weeks ago, noted yellowish discoloration of her urine for approximately a week, dizziness, vertigo, seen by her PCP on day prior to admission when she did lab work, had couple of episodes of nonbloody emesis on day of admission and advised by her PCP to come to the ED. She ate at [employer] and at a barbecue place over the last couple of days but food tasted okay and no sick contacts with similar symptoms. Denies alcohol use. Denies other risky behaviors for hepatitis i.e. sexual or drug abuse. Noted to have acute hepatitis. [facility] consulted. Improving.   Assessment & Plan:   Principal Problem:   Transaminitis Active Problems:   Paresthesia   Hypothyroidism   Essential hypertension   Hyperlipidemia   Hyperglycemia   CKD (chronic kidney disease) stage 3, GFR 30-59 ml/min   1. Acute hepatitis: Unclear etiology.? Related to medications i.e. Crestor. Acute viral hepatitis screen negative. Acetaminophen level negative. Presented with AST: 1024, ALT: 1777. CT abdomen and pelvis without acute findings. [facility] input appreciated and suspect hepatitis is due to medications. Recommends advancing diet as tolerated, checking autoimmune serologies, hold all medicines except Synthroid. LFTs have improved. Remains on Protonix and fluoxetine. Follow LFTs in a.m. And if tolerates diet and labs per her, DC home with close outpatient follow-up with GI. 2. Essential hypertension: Soft blood pressures. Holding Diovan-HCT. 3. Hypothyroid:  Clinically euthyroid. Continue Synthroid. 4. Hyperlipidemia: Holding Crestor for now due to problem #1. 5. Anemia:? Dilutional. No bleeding reported. Follow CBC in a.m. 6. ? Peripheral neuropathy: Follows with Dr. Francee Nodal, Neurology. Only took 2 tablets of the gabapentin and stopped because she just "did not feel right".   DVT prophylaxis: Lovenox Code Status: Full Family Communication: None at bedside Disposition: DC home when clinically improved, possibly 7/27.   Consultants:  [facility]   Procedures:  None  Antimicrobials:  None    Subjective: Seen this morning. Mild nausea but no further emesis. Slightly lightheaded but no vertigo. No pain reported.   ROS: Denies chest pain, dyspnea.  Objective:  Vitals:   04/01/17 2135 04/02/17 0054 04/02/17 0519 04/02/17 1400  BP: 116/69 (!) 99/59 (!) 102/55 (!) 94/56  Pulse: 68 64 66 63  Resp: 18 18 20 19   Temp: 98.2 F (36.8 C) 97.8 F (36.6 C) 98 F (36.7 C) 98.8 F (37.1 C)  TempSrc: Oral Oral Oral Oral  SpO2: 97% 96% 95% 94%  Weight: 91.2 kg (201 lb 1 oz)     Height: 5\' 4"  (1.626 m)       Examination:  General exam: Pleasant middle-aged female lying comfortably supine in bed. Scleral and skin icterus. Respiratory system: Clear to auscultation. Respiratory effort normal. Cardiovascular system: S1 & S2 heard, RRR. No JVD, murmurs, rubs, gallops or clicks. No pedal edema. Gastrointestinal system: Abdomen is nondistended, soft and nontender. No organomegaly or masses felt. Normal bowel sounds heard. Central nervous system: Alert and oriented. No focal neurological deficits. Extremities: Symmetric 5 x 5 power. Skin: No rashes, lesions or  ulcers Psychiatry: Judgement and insight appear normal. Mood & affect appropriate.     Data Reviewed: I have personally reviewed following labs and imaging studies  CBC:  Recent Labs Lab 04/01/17 1400 04/02/17 0110  WBC 5.6 5.7  HGB 13.3 11.8*  HCT 41.0 36.3  MCV 88.4 89.0   PLT 290 935   Basic Metabolic Panel:  Recent Labs Lab 04/01/17 1355 04/02/17 0110  NA 134* 139  K 4.1 4.2  CL 100* 106  CO2 26 28  GLUCOSE 120* 138*  BUN 11 8  CREATININE 1.14* 1.13*  CALCIUM 10.5* 9.1   Liver Function Tests:  Recent Labs Lab 04/01/17 1355 04/02/17 0755  AST 1,024* 741*  ALT 1,777* 1,326*  ALKPHOS 150* 112  BILITOT 7.6* 5.9*  PROT 8.3* 7.1  ALBUMIN 3.6 3.0*   Coagulation Profile:  Recent Labs Lab 04/01/17 1600  INR 1.17     Radiology Studies: Ct Abdomen Pelvis W Contrast  Result Date: 04/01/2017 CLINICAL DATA:  66 year old female with transaminitis and possible acute hepatitis EXAM: CT ABDOMEN AND PELVIS WITH CONTRAST TECHNIQUE: Multidetector CT imaging of the abdomen and pelvis was performed using the standard protocol following bolus administration of intravenous contrast. CONTRAST:  100 mL Isovue 300 % COMPARISON:  Prior CT abdomen/ pelvis 03/15/2003 FINDINGS: Lower chest: Mild dependent atelectasis in the lower lobes. The heart is at the upper limits of normal for size. No pericardial effusion. Small hiatal hernia. No acute abnormality. Hepatobiliary: No focal liver abnormality is seen. Status post cholecystectomy. No biliary dilatation. Pancreas: Unremarkable. No pancreatic ductal dilatation or surrounding inflammatory changes. Spleen: Normal in size without focal abnormality. Adrenals/Urinary Tract: Normal adrenal glands. Symmetric renal parenchymal enhancement bilaterally. No enhancing mass, nephrolithiasis or hydronephrosis. Two simple cysts are noted in the left upper and interpolar kidney. Unremarkable ureters and bladder. Stomach/Bowel: No evidence of obstruction or focal bowel wall thickening. Normal appendix in the right lower quadrant. The terminal ileum is unremarkable. Vascular/Lymphatic: Minimal atherosclerotic calcifications of the about the aorta. No evidence of aneurysm. The hepatic and portal veins are patent. No suspicious  lymphadenopathy. Reproductive: Uterus and bilateral adnexa are unremarkable. Other: No abdominal wall hernia or abnormality. No abdominopelvic ascites. Musculoskeletal: No acute fracture or aggressive appearing lytic or blastic osseous lesion. IMPRESSION: 1. No acute abnormality in the abdomen. Specifically, the CT appearance the liver is unremarkable save for surgical changes of prior cholecystectomy. 2. Borderline cardiomegaly. 3. Simple left side renal cysts. 4.  Aortic Atherosclerosis (ICD10-170.0) Electronically Signed   By: Jacqulynn Cadet M.D.   On: 04/01/2017 18:31        Scheduled Meds: . aspirin  81 mg Oral Daily  . docusate sodium  100 mg Oral BID  . enoxaparin (LOVENOX) injection  40 mg Subcutaneous Q24H  . FLUoxetine  40 mg Oral Daily  . levothyroxine  75 mcg Oral QAC breakfast  . pantoprazole  40 mg Oral Daily   Continuous Infusions: . sodium chloride 125 mL/hr at 04/01/17 2205     LOS: 0 days     Aseret Hoffman, MD, FACP, FHM. [facility] Pager [phone]  If 7PM-7AM, please contact night-coverage [email or web address] Password [facility] 04/02/2017, 3:40 PM

## 2017-04-02 NOTE — Care Management Obs Status (Signed)
[facility] NOTIFICATION   Patient Details  Name: KIMAYA WHITLATCH MRN: [id number] Date of Birth: May 26, 1951   Medicare Observation Status Notification Given:  Yes    Pollie Friar, RN 04/02/2017, 2:57 PM

## 2017-04-03 DIAGNOSIS — B179 Acute viral hepatitis, unspecified: Secondary | ICD-10-CM | POA: Diagnosis present

## 2017-04-03 DIAGNOSIS — R17 Unspecified jaundice: Secondary | ICD-10-CM

## 2017-04-03 DIAGNOSIS — E785 Hyperlipidemia, unspecified: Secondary | ICD-10-CM

## 2017-04-03 DIAGNOSIS — I10 Essential (primary) hypertension: Secondary | ICD-10-CM | POA: Diagnosis not present

## 2017-04-03 LAB — COMPREHENSIVE METABOLIC PANEL
ALBUMIN: 3 g/dL — AB (ref 3.5–5.0)
ALT: 1395 U/L — ABNORMAL HIGH (ref 14–54)
ANION GAP: 7 (ref 5–15)
AST: 813 U/L — ABNORMAL HIGH (ref 15–41)
Alkaline Phosphatase: 127 U/L — ABNORMAL HIGH (ref 38–126)
BUN: 7 mg/dL (ref 6–20)
CO2: 25 mmol/L (ref 22–32)
Calcium: 8.8 mg/dL — ABNORMAL LOW (ref 8.9–10.3)
Chloride: 105 mmol/L (ref 101–111)
Creatinine, Ser: 0.9 mg/dL (ref 0.44–1.00)
GFR calc Af Amer: >60 mL/min (ref >60)
GFR calc non Af Amer: >60 mL/min (ref >60)
GLUCOSE: 106 mg/dL — AB (ref 65–99)
Potassium: 4.2 mmol/L (ref 3.5–5.1)
SODIUM: 137 mmol/L (ref 135–145)
TOTAL PROTEIN: 7.1 g/dL (ref 6.5–8.1)
Total Bilirubin: 7.2 mg/dL — ABNORMAL HIGH (ref 0.3–1.2)

## 2017-04-03 LAB — CBC WITH DIFFERENTIAL/PLATELET
BASOS ABS: 0 10*3/uL (ref 0.0–0.1)
Basophils Relative: 1 %
Eosinophils Absolute: 0.2 10*3/uL (ref 0.0–0.7)
Eosinophils Relative: 4 %
HEMATOCRIT: 35.7 % — AB (ref 36.0–46.0)
Hemoglobin: 11.6 g/dL — ABNORMAL LOW (ref 12.0–15.0)
LYMPHS PCT: 39 %
Lymphs Abs: 2 10*3/uL (ref 0.7–4.0)
MCH: 29 pg (ref 26.0–34.0)
MCHC: 32.5 g/dL (ref 30.0–36.0)
MCV: 89.3 fL (ref 78.0–100.0)
MONO ABS: 0.8 10*3/uL (ref 0.1–1.0)
MONOS PCT: 16 %
NEUTROS ABS: 2.1 10*3/uL (ref 1.7–7.7)
Neutrophils Relative %: 40 %
Platelets: 273 10*3/uL (ref 150–400)
RBC: 4 MIL/uL (ref 3.87–5.11)
RDW: 16.7 % — AB (ref 11.5–15.5)
WBC: 5.1 10*3/uL (ref 4.0–10.5)

## 2017-04-03 LAB — FERRITIN: Ferritin: 864 ng/mL — ABNORMAL HIGH (ref 11–307)

## 2017-04-03 MED ORDER — FLUOXETINE HCL 20 MG PO CAPS: 20.0000 mg | ORAL_CAPSULE | Freq: Every day | ORAL | 0 refills | Status: DC

## 2017-04-03 MED ORDER — FAMOTIDINE 20 MG PO TABS: 20.0000 mg | ORAL_TABLET | Freq: Every day | ORAL | 0 refills | Status: AC

## 2017-04-03 MED ORDER — FLUOXETINE HCL 20 MG PO CAPS
20.0000 mg | ORAL_CAPSULE | Freq: Every day | ORAL | Status: DC
  Administered 2017-04-03: 20 mg via ORAL
  Filled 2017-04-03: qty 1

## 2017-04-03 MED ORDER — FAMOTIDINE 20 MG PO TABS
20.0000 mg | ORAL_TABLET | Freq: Every day | ORAL | Status: DC
  Administered 2017-04-03: 20 mg via ORAL
  Filled 2017-04-03: qty 1

## 2017-04-03 MED ORDER — ZOLPIDEM TARTRATE 10 MG PO TABS: 5.0000 mg | ORAL_TABLET | Freq: Every evening | ORAL | Status: AC | PRN

## 2017-04-03 NOTE — Progress Notes (Signed)
Patient discharged home with family. Discharge information given. IV removed. Patient questions asked and answered. Patient left unit via wheelchair with volunteer services. Yolanda Oconnell

## 2017-04-03 NOTE — Care Management Note (Signed)
Case Management Note  Patient Details  Name: Yolanda Oconnell MRN: [id number] Date of Birth: February 16, 1951  Subjective/Objective:                    Action/Plan: Pt discharging home with self care. Pt has PCP, insurance and transportation home. No further needs per CM.   Expected Discharge Date:  04/03/17               Expected Discharge Plan:  Home/Self Care  In-House Referral:     Discharge planning Services     Post Acute Care Choice:    Choice offered to:     DME Arranged:    DME Agency:     HH Arranged:    HH Agency:     Status of Service:  Completed, signed off  If discussed at [employer] of Stay Meetings, dates discussed:    Additional Comments:  Pollie Friar, RN 04/03/2017, 11:21 AM

## 2017-04-03 NOTE — Discharge Summary (Signed)
Physician Discharge Summary  Yolanda Oconnell OMV:[id number] DOB: February 20, 1951  PCP: Carol Ada, MD  Admit date: 04/01/2017 Discharge date: 04/03/2017  Recommendations for Outpatient Follow-up:  1. Dr. Clarene Essex, [facility] on 04/06/2017. To be seen with repeat labs (CBC, BMP, LFTs). Please follow up outstanding serologies that were sent from the hospital. 2. Dr. Carol Ada, PCP in 5 days.  Home Health: None Equipment/Devices: None    Discharge Condition: Improved and stable  CODE STATUS: Full  Diet recommendation: Heart healthy diet.  Discharge Diagnoses:  Principal Problem:   Acute hepatitis Active Problems:   Paresthesia   Transaminitis   Hypothyroidism   Essential hypertension   Hyperlipidemia   Hyperglycemia   CKD (chronic kidney disease) stage 3, GFR 30-59 ml/min   Brief Summary: 66 year old female with PMH of GERD, HLD (changed back from atorvastatin to Crestor 2 months back. Had been on Crestor in the past), HTN, hypothyroid, chronic intermittent vertigo,? Right lower extremity peripheral neuropathy for which she took 2 doses of gabapentin approximately 2 weeks ago, on Prozac for hot flashes noted yellowish discoloration of her urine for approximately a week, dizziness, vertigo, seen by her PCP on day prior to admission when she did lab work, had couple of episodes of nonbloody emesis on day of admission and advised by her PCP to come to the ED. She ate at [employer] and at a barbecue place over the last couple of days pta but food tasted okay and no sick contacts with similar symptoms. Denies alcohol use. Denies other risky behaviors for hepatitis i.e. sexual or drug abuse. Noted to have acute hepatitis. [facility] consulted.    Assessment & Plan:   1. Acute hepatitis: Unclear etiology.? Related to medications i.e. Crestor. Acute viral hepatitis screen negative. Acetaminophen level negative. Presented with AST: 1024, ALT: 1777. CT abdomen and pelvis without acute  findings. [facility] input appreciated and suspect hepatitis is due to medications. I discussed in detail with Dr. Watt Climes and went over her case in detail. Patient has been tolerating diet. Transaminases have not significantly changed since yesterday. Bilirubin is slightly higher. He recommended discharging her home with outpatient follow-up in his office on Monday 04/06/17 with repeat labs. He recommended discontinuing PPI and use H2 blocker instead, reduce her dose of Prozac by half and he may consider stopping this altogether if her LFTs do not improve as outpatient, DC niacin and statins for now. He will follow-up multiple serology labs (autoimmune than others) that were sent from the hospital. Ferritin: 864. 2. Essential hypertension: Soft blood pressures. Holding Diovan-HCT at this time. Close follow-up as outpatient with PCP to determine timing of resumption of her antihypertensives. Patient advised increased oral fluid and salt intake. She was also advised regarding gradual transitioning of activity from lying to standing and ambulating. Her mild intermittent dizziness may be related to the soft blood pressures. She is not orthostatic. 3. Hypothyroid: Clinically euthyroid. Continue Synthroid. TSH normal. 4. Hyperlipidemia: Discontinued Crestor and niacin at this time due to acute hepatitis. Discussion as per problem #1.  5. Anemia:? Dilutional. No bleeding reported. Stable. Outpatient follow-up. 6. ? Peripheral neuropathy: Follows with Dr. Margette Fast, [facility]. Only took 2 tablets of the gabapentin and stopped because she just "did not feel right". Discontinued gabapentin.   Consultants:  [facility]   Procedures:  None   Discharge Instructions  Discharge Instructions    Call MD for:    Complete by:  As directed    Worsening jaundice or  itching.   Call MD for:  extreme fatigue    Complete by:  As directed    Call MD for:  persistant dizziness or light-headedness    Complete  by:  As directed    Call MD for:  persistant nausea and vomiting    Complete by:  As directed    Diet - low sodium heart healthy    Complete by:  As directed    Increase activity slowly    Complete by:  As directed        Medication List    STOP taking these medications   acetaminophen 500 MG tablet Commonly known as:  TYLENOL   alendronate 70 MG tablet Commonly known as:  FOSAMAX   CALCIUM 600 + D PO   cetirizine 10 MG tablet Commonly known as:  ZYRTEC   esomeprazole 40 MG capsule Commonly known as:  NEXIUM   gabapentin 100 MG capsule Commonly known as:  NEURONTIN   multivitamin tablet   niacin 500 MG tablet   rosuvastatin 20 MG tablet Commonly known as:  CRESTOR   valsartan-hydrochlorothiazide 320-25 MG tablet Commonly known as:  DIOVAN-HCT   Vitamin D 2000 units Caps     TAKE these medications   ALPRAZolam 0.5 MG tablet Commonly known as:  XANAX Take 0.5 mg by mouth as needed for anxiety.   ASPIRIN 81 PO Take 1 tablet by mouth daily.   famotidine 20 MG tablet Commonly known as:  PEPCID Take 1 tablet (20 mg total) by mouth daily.   FLUoxetine 20 MG capsule Commonly known as:  PROZAC Take 1 capsule (20 mg total) by mouth daily. What changed:  medication strength  how much to take   levothyroxine 75 MCG tablet Commonly known as:  SYNTHROID, LEVOTHROID Take 75 mcg by mouth daily.   zolpidem 10 MG tablet Commonly known as:  AMBIEN Take 0.5 tablets (5 mg total) by mouth at bedtime as needed for sleep. What changed:  how much to take      Follow-up Information    Clarene Essex, MD. Schedule an appointment as soon as possible for a visit on 04/06/2017.   Specialty:  Gastroenterology Why:  To be seen with repeat labs (CBC, BMP, LFT's). Contact information: [address] [place] [address] [phone]        Carol Ada, MD. Schedule an appointment as soon as possible for a visit in 5 day(s).   Specialty:  Family  Medicine Why:  Follow up regarding management of your Hypertension/high BP. Contact information: [address] [place] [address] [phone]          Allergies  Allergen Reactions  . Compazine Swelling    Tongue and eyes      Procedures/Studies: Ct Abdomen Pelvis W Contrast  Result Date: 04/01/2017 CLINICAL DATA:  66 year old female with transaminitis and possible acute hepatitis EXAM: CT ABDOMEN AND PELVIS WITH CONTRAST TECHNIQUE: Multidetector CT imaging of the abdomen and pelvis was performed using the standard protocol following bolus administration of intravenous contrast. CONTRAST:  100 mL Isovue 300 % COMPARISON:  Prior CT abdomen/ pelvis 03/15/2003 FINDINGS: Lower chest: Mild dependent atelectasis in the lower lobes. The heart is at the upper limits of normal for size. No pericardial effusion. Small hiatal hernia. No acute abnormality. Hepatobiliary: No focal liver abnormality is seen. Status post cholecystectomy. No biliary dilatation. Pancreas: Unremarkable. No pancreatic ductal dilatation or surrounding inflammatory changes. Spleen: Normal in size without focal abnormality. Adrenals/Urinary Tract:  Normal adrenal glands. Symmetric renal parenchymal enhancement bilaterally. No enhancing mass, nephrolithiasis or hydronephrosis. Two simple cysts are noted in the left upper and interpolar kidney. Unremarkable ureters and bladder. Stomach/Bowel: No evidence of obstruction or focal bowel wall thickening. Normal appendix in the right lower quadrant. The terminal ileum is unremarkable. Vascular/Lymphatic: Minimal atherosclerotic calcifications of the about the aorta. No evidence of aneurysm. The hepatic and portal veins are patent. No suspicious lymphadenopathy. Reproductive: Uterus and bilateral adnexa are unremarkable. Other: No abdominal wall hernia or abnormality. No abdominopelvic ascites. Musculoskeletal: No acute fracture or aggressive appearing lytic or blastic  osseous lesion. IMPRESSION: 1. No acute abnormality in the abdomen. Specifically, the CT appearance the liver is unremarkable save for surgical changes of prior cholecystectomy. 2. Borderline cardiomegaly. 3. Simple left side renal cysts. 4.  Aortic Atherosclerosis (ICD10-170.0) Electronically Signed   By: Jacqulynn Cadet M.D.   On: 04/01/2017 18:31      Subjective: Seen this morning. Overall feels better. No nausea, vomiting. Tolerating diet. Had an episode of soft yellow stools yesterday. No itching reported. Slightly dizzy at times but no vertigo. Denies any other complaints.  Discharge Exam:  Vitals:   04/02/17 2043 04/03/17 0021 04/03/17 0451 04/03/17 1050  BP: (!) 96/59 (!) 101/55 105/62   Pulse: 66 69 (!) 53   Resp: 18 20 18    Temp: 98.3 F (36.8 C) 98 F (36.7 C) 98.7 F (37.1 C) 98.2 F (36.8 C)  TempSrc: Oral Oral Oral Oral  SpO2: 96% 98% 95%   Weight:      Height:        General exam: Pleasant middle-aged female sitting up comfortably in chair this morning. Mild scleral and skin icterus. Respiratory system: Clear to auscultation. Respiratory effort normal. Cardiovascular system: S1 & S2 heard, RRR. No JVD, murmurs, rubs, gallops or clicks. No pedal edema. Gastrointestinal system: Abdomen is nondistended, soft and nontender. No organomegaly or masses felt. Normal bowel sounds heard. Central nervous system: Alert and oriented. No focal neurological deficits. Extremities: Symmetric 5 x 5 power. Skin: No rashes, lesions or ulcers Psychiatry: Judgement and insight appear normal. Mood & affect appropriate.     The results of significant diagnostics from this hospitalization (including imaging, microbiology, ancillary and laboratory) are listed below for reference.      Labs: CBC:  Recent Labs Lab 04/01/17 1400 04/02/17 0110 04/03/17 0426  WBC 5.6 5.7 5.1  NEUTROABS  --   --  2.1  HGB 13.3 11.8* 11.6*  HCT 41.0 36.3 35.7*  MCV 88.4 89.0 89.3  PLT 290 244  341   Basic Metabolic Panel:  Recent Labs Lab 04/01/17 1355 04/02/17 0110 04/03/17 0426  NA 134* 139 137  K 4.1 4.2 4.2  CL 100* 106 105  CO2 26 28 25   GLUCOSE 120* 138* 106*  BUN 11 8 7   CREATININE 1.14* 1.13* 0.90  CALCIUM 10.5* 9.1 8.8*   Liver Function Tests:  Recent Labs Lab 04/01/17 1355 04/02/17 0755 04/03/17 0426  AST 1,024* 741* 813*  ALT 1,777* 1,326* 1,395*  ALKPHOS 150* 112 127*  BILITOT 7.6* 5.9* 7.2*  PROT 8.3* 7.1 7.1  ALBUMIN 3.6 3.0* 3.0*   Thyroid function studies  Recent Labs  04/02/17 0110  TSH 1.403   Anemia work up  Recent Labs  04/03/17 0426  FERRITIN 864*   Urinalysis    Component Value Date/Time   COLORURINE YELLOW 04/01/2017 1902   APPEARANCEUR CLEAR 04/01/2017 1902   LABSPEC 1.023 04/01/2017 1902   PHURINE  7.0 04/01/2017 1902   GLUCOSEU NEGATIVE 04/01/2017 1902   HGBUR NEGATIVE 04/01/2017 1902   BILIRUBINUR NEGATIVE 04/01/2017 1902   KETONESUR NEGATIVE 04/01/2017 1902   PROTEINUR NEGATIVE 04/01/2017 1902   NITRITE NEGATIVE 04/01/2017 1902   LEUKOCYTESUR NEGATIVE 04/01/2017 1902      Time coordinating discharge: Over 30 minutes  SIGNED:  Vernell Leep, MD, FACP, FHM. [facility] Pager [phone]  If 7PM-7AM, please contact night-coverage [email or web address] Password [facility] 04/03/2017, 11:04 AM

## 2017-04-03 NOTE — Discharge Instructions (Signed)

## 2017-04-04 LAB — IGG, IGA, IGM
IgA: 490 mg/dL — ABNORMAL HIGH (ref 87–352)
IgG (Immunoglobin G), Serum: 1976 mg/dL — ABNORMAL HIGH (ref 700–1600)
IgM, Serum: 58 mg/dL (ref 26–217)

## 2017-04-04 LAB — ALPHA-1-ANTITRYPSIN: A-1 Antitrypsin, Ser: 166 mg/dL (ref 90–200)

## 2017-04-04 LAB — CERULOPLASMIN: Ceruloplasmin: 23.9 mg/dL (ref 19.0–39.0)

## 2017-04-05 LAB — MITOCHONDRIAL ANTIBODIES: Mitochondrial M2 Ab, IgG: 8.2 Units (ref 0.0–20.0)

## 2017-04-05 LAB — ANTI-SMOOTH MUSCLE ANTIBODY, IGG: F-Actin IgG: 41 Units — ABNORMAL HIGH (ref 0–19)

## 2017-04-06 LAB — ANTINUCLEAR ANTIBODIES, IFA: ANTINUCLEAR ANTIBODIES, IFA: NEGATIVE

## 2017-04-07 ENCOUNTER — Encounter: Payer: Medicare Other | Admitting: Neurology

## 2017-04-15 ENCOUNTER — Other Ambulatory Visit (HOSPITAL_COMMUNITY): Payer: Self-pay | Admitting: Gastroenterology

## 2017-04-15 DIAGNOSIS — R7989 Other specified abnormal findings of blood chemistry: Secondary | ICD-10-CM

## 2017-04-15 DIAGNOSIS — R945 Abnormal results of liver function studies: Principal | ICD-10-CM

## 2017-04-17 ENCOUNTER — Other Ambulatory Visit: Payer: Self-pay | Admitting: Radiology

## 2017-04-20 ENCOUNTER — Other Ambulatory Visit: Payer: Self-pay | Admitting: Radiology

## 2017-04-21 ENCOUNTER — Encounter (HOSPITAL_COMMUNITY): Payer: Self-pay

## 2017-04-21 ENCOUNTER — Ambulatory Visit (HOSPITAL_COMMUNITY)
Admission: RE | Admit: 2017-04-21 | Discharge: 2017-04-21 | Disposition: A | Discharge status: Home / Self Care | Payer: Medicare Other | Source: Ambulatory Visit | Attending: Gastroenterology | Admitting: Gastroenterology

## 2017-04-21 DIAGNOSIS — D869 Sarcoidosis, unspecified: Secondary | ICD-10-CM | POA: Diagnosis not present

## 2017-04-21 DIAGNOSIS — Z7982 Long term (current) use of aspirin: Secondary | ICD-10-CM | POA: Diagnosis not present

## 2017-04-21 DIAGNOSIS — E785 Hyperlipidemia, unspecified: Secondary | ICD-10-CM | POA: Insufficient documentation

## 2017-04-21 DIAGNOSIS — I517 Cardiomegaly: Secondary | ICD-10-CM | POA: Diagnosis not present

## 2017-04-21 DIAGNOSIS — K759 Inflammatory liver disease, unspecified: Secondary | ICD-10-CM | POA: Diagnosis not present

## 2017-04-21 DIAGNOSIS — Z9049 Acquired absence of other specified parts of digestive tract: Secondary | ICD-10-CM | POA: Diagnosis not present

## 2017-04-21 DIAGNOSIS — K831 Obstruction of bile duct: Secondary | ICD-10-CM | POA: Insufficient documentation

## 2017-04-21 DIAGNOSIS — K219 Gastro-esophageal reflux disease without esophagitis: Secondary | ICD-10-CM | POA: Insufficient documentation

## 2017-04-21 DIAGNOSIS — E039 Hypothyroidism, unspecified: Secondary | ICD-10-CM | POA: Diagnosis not present

## 2017-04-21 DIAGNOSIS — R7989 Other specified abnormal findings of blood chemistry: Secondary | ICD-10-CM

## 2017-04-21 DIAGNOSIS — I119 Hypertensive heart disease without heart failure: Secondary | ICD-10-CM | POA: Insufficient documentation

## 2017-04-21 DIAGNOSIS — R945 Abnormal results of liver function studies: Secondary | ICD-10-CM

## 2017-04-21 LAB — CBC
HCT: 36.9 % (ref 36.0–46.0)
HEMOGLOBIN: 12.6 g/dL (ref 12.0–15.0)
MCH: 29.5 pg (ref 26.0–34.0)
MCHC: 34.1 g/dL (ref 30.0–36.0)
MCV: 86.4 fL (ref 78.0–100.0)
PLATELETS: 317 10*3/uL (ref 150–400)
RBC: 4.27 MIL/uL (ref 3.87–5.11)
RDW: 21.4 % — AB (ref 11.5–15.5)
WBC: 10 10*3/uL (ref 4.0–10.5)

## 2017-04-21 LAB — PROTIME-INR
INR: 1.34
PROTHROMBIN TIME: 16.6 s — AB (ref 11.4–15.2)

## 2017-04-21 LAB — APTT: aPTT: 34 seconds (ref 24–36)

## 2017-04-21 MED ORDER — SODIUM CHLORIDE 0.9 % IV SOLN: INTRAVENOUS | Status: DC

## 2017-04-21 MED ORDER — LIDOCAINE-EPINEPHRINE 1 %-1:100000 IJ SOLN
INTRAMUSCULAR | Status: AC
  Filled 2017-04-21: qty 1

## 2017-04-21 MED ORDER — GELATIN ABSORBABLE 12-7 MM EX MISC
CUTANEOUS | Status: AC
  Filled 2017-04-21: qty 1

## 2017-04-21 MED ORDER — FENTANYL CITRATE (PF) 100 MCG/2ML IJ SOLN
INTRAMUSCULAR | Status: AC | PRN
  Administered 2017-04-21 (×2): 50 ug via INTRAVENOUS

## 2017-04-21 MED ORDER — MIDAZOLAM HCL 2 MG/2ML IJ SOLN
INTRAMUSCULAR | Status: AC
  Filled 2017-04-21: qty 2

## 2017-04-21 MED ORDER — GELATIN ABSORBABLE 12-7 MM EX MISC
CUTANEOUS | Status: AC | PRN
  Administered 2017-04-21: 1 via TOPICAL

## 2017-04-21 MED ORDER — MIDAZOLAM HCL 2 MG/2ML IJ SOLN
INTRAMUSCULAR | Status: AC | PRN
  Administered 2017-04-21 (×2): 1 mg via INTRAVENOUS

## 2017-04-21 MED ORDER — SODIUM CHLORIDE 0.9 % IV SOLN
INTRAVENOUS | Status: AC | PRN
  Administered 2017-04-21: 50 mL/h via INTRAVENOUS

## 2017-04-21 MED ORDER — FENTANYL CITRATE (PF) 100 MCG/2ML IJ SOLN
INTRAMUSCULAR | Status: AC
  Filled 2017-04-21: qty 2

## 2017-04-21 NOTE — Sedation Documentation (Signed)
Pt transported to SS 4, VS and wound site reviewed with Threasa Beards, Therapist, sports.  Pt tolerated procedure well.

## 2017-04-21 NOTE — Discharge Instructions (Addendum)
Liver Biopsy, Care After °Refer to this sheet in the next few weeks. These instructions provide you with information on caring for yourself after your procedure. Your health care provider may also give you more specific instructions. Your treatment has been planned according to current medical practices, but problems sometimes occur. Call your health care provider if you have any problems or questions after your procedure. °What can I expect after the procedure? °After your procedure, it is typical to have the following: °· A small amount of discomfort in the area where the biopsy was done and in the right shoulder or shoulder blade. °· A small amount of bruising around the area where the biopsy was done and on the skin over the liver. °· Sleepiness and fatigue for the rest of the day. ° °Follow these instructions at home: °· Rest at home for 1-2 days or as directed by your health care provider. °· Have a friend or family member stay with you for at least 24 hours. °· Because of the medicines used during the procedure, you should not do the following things in the first 24 hours: °? Drive. °? Use machinery. °? Be responsible for the care of other people. °? Sign legal documents. °? Take a bath or shower. °· There are many different ways to close and cover an incision, including stitches, skin glue, and adhesive strips. Follow your health care provider's instructions on: °? Incision care. °? Bandage (dressing) changes and removal. °? Incision closure removal. °· Do not drink alcohol in the first week. °· Do not lift more than 5 pounds or play contact sports for 2 weeks after this test. °· Take medicines only as directed by your health care provider. Do not take medicine containing aspirin or non-steroidal anti-inflammatory medicines such as ibuprofen for 1 week after this test. °· It is your responsibility to get your test results. °Contact a health care provider if: °· You have increased bleeding from an incision  that results in more than a small spot of blood. °· You have redness, swelling, or increasing pain in any incisions. °· You notice a discharge or a bad smell coming from any of your incisions. °· You have a fever or chills. °Get help right away if: °· You develop swelling, bloating, or pain in your abdomen. °· You become dizzy or faint. °· You develop a rash. °· You are nauseous or vomit. °· You have difficulty breathing, feel short of breath, or feel faint. °· You develop chest pain. °· You have problems with your speech or vision. °· You have trouble balancing or moving your arms or legs. °This information is not intended to replace advice given to you by your health care provider. Make sure you discuss any questions you have with your health care provider. °Document Released: 03/14/2005 Document Revised: 01/31/2016 Document Reviewed: 10/21/2013 °Elsevier Interactive Patient Education © 2018 [employer] °Moderate Conscious Sedation, Adult, Care After °These instructions provide you with information about caring for yourself after your procedure. Your health care provider may also give you more specific instructions. Your treatment has been planned according to current medical practices, but problems sometimes occur. Call your health care provider if you have any problems or questions after your procedure. °What can I expect after the procedure? °After your procedure, it is common: °· To feel sleepy for several hours. °· To feel clumsy and have poor balance for several hours. °· To have poor judgment for several hours. °· To vomit if you eat   too soon. ° °Follow these instructions at home: °For at least 24 hours after the procedure: ° °· Do not: °? Participate in activities where you could fall or become injured. °? Drive. °? Use heavy machinery. °? Drink alcohol. °? Take sleeping pills or medicines that cause drowsiness. °? Make important decisions or sign legal documents. °? Take care of children on your  own. °· Rest. °Eating and drinking °· Follow the diet recommended by your health care provider. °· If you vomit: °? Drink water, juice, or soup when you can drink without vomiting. °? Make sure you have little or no nausea before eating solid foods. °General instructions °· Have a responsible adult stay with you until you are awake and alert. °· Take over-the-counter and prescription medicines only as told by your health care provider. °· If you smoke, do not smoke without supervision. °· Keep all follow-up visits as told by your health care provider. This is important. °Contact a health care provider if: °· You keep feeling nauseous or you keep vomiting. °· You feel light-headed. °· You develop a rash. °· You have a fever. °Get help right away if: °· You have trouble breathing. °This information is not intended to replace advice given to you by your health care provider. Make sure you discuss any questions you have with your health care provider. °Document Released: 06/15/2013 Document Revised: 01/28/2016 Document Reviewed: 12/15/2015 °Elsevier Interactive Patient Education © 2018 [employer] ° °

## 2017-04-21 NOTE — Sedation Documentation (Signed)
Patient is resting comfortably. 

## 2017-04-21 NOTE — Sedation Documentation (Signed)
O2 d/c'd 

## 2017-04-21 NOTE — Sedation Documentation (Addendum)
Dr Pascal Lux in, procedure, risks and benefits explained, ready to proceed.   Moved to [place] suite.

## 2017-04-21 NOTE — H&P (Signed)
Chief Complaint: Patient was seen in consultation today for liver core biopsy at the request of Magod,Marc  Referring Physician(s): Magod,Marc  Supervising Physician: Sandi Mariscal  Patient Status: [facility] - Out-pt  History of Present Illness: Yolanda Oconnell is a 66 y.o. female   Pt admitted to [facility] 04/01/17 with dark urine; fatigue and N/V Work up revealed jaundice and elevated LFTs Hepatitis- possibly secondary to medications Was seen by Dr Watt Climes in hospital and as OP Discharged from [facility] 04/03/17 Now scheduled for liver core biopsy  Past Medical History:  Diagnosis Date  . Arthritis    arthritis-knees, shoulders  . Environmental allergies   . GERD (gastroesophageal reflux disease)   . Hyperlipidemia   . Hypertension   . Hypothyroid   . Neurological symptoms    R-sided pain/numbness/tingling/cold sensation in foot; arm and leg pain periodically - she is seeing a neurologist for this  . PONV (postoperative nausea and vomiting)   . Sarcoidosis 2004   tested positive-no active disease    Past Surgical History:  Procedure Laterality Date  . cerivcal polyp removal  10-1999   Dr Phineas Real  . COLONOSCOPY WITH PROPOFOL N/A 05/13/2016   Procedure: COLONOSCOPY WITH PROPOFOL;  Surgeon: Garlan Fair, MD;  Location: [facility];  Service: Endoscopy;  Laterality: N/A;  . herniated disc surgery  07/1994   Dr Roy-cervical  . Laparoscopic Gallbladder Surgery  12-1999   Dr Neldon Mc  . MEDIASTINOSCOPY  2004   granulomatous adenopathy c/w sarcoid  . PUBOVAGINAL SLING N/A 01/16/2015   Procedure: Margarita Grizzle SPARC;  Surgeon: Bjorn Loser, MD;  Location: [facility];  Service: Urology;  Laterality: N/A;  . Right Rotator Cuff Surgery  [phone]; [phone]   Dr Durward Fortes  . Thumb Surgery Right 04/1998   Dr Yates Decamp cracked  . TUBAL LIGATION  12-1976   Dr Manus Rudd    Allergies: Compazine  Medications: Prior to Admission medications   Medication Sig  Start Date End Date Taking? Authorizing Provider  acetaminophen (TYLENOL) 500 MG tablet Take 500 mg by mouth daily as needed for moderate pain or headache.   Yes [provider]  ALPRAZolam Duanne Moron) 0.5 MG tablet Take 0.5 mg by mouth 2 (two) times daily as needed for anxiety.    Yes [provider]  ASPIRIN 81 PO Take 81 mg by mouth daily.    Yes [provider]  diphenhydrAMINE (BENADRYL) 25 MG tablet Take 12.5 mg by mouth 2 (two) times daily as needed (rash).   Yes [provider]  docusate sodium (COLACE) 100 MG capsule Take 100 mg by mouth daily as needed for mild constipation.   Yes [provider]  esomeprazole (NEXIUM) 40 MG capsule Take 40 mg by mouth daily at 12 noon.   Yes [provider]  famotidine (PEPCID) 20 MG tablet Take 1 tablet (20 mg total) by mouth daily. Patient taking differently: Take 20 mg by mouth 2 (two) times daily.  04/03/17  Yes Hongalgi, Lenis Dickinson, MD  levothyroxine (SYNTHROID, LEVOTHROID) 75 MCG tablet Take 75 mcg by mouth daily before breakfast.    Yes [provider]  predniSONE (DELTASONE) 20 MG tablet Take 40 mg by mouth daily. 04/15/17  Yes [provider]  zolpidem (AMBIEN) 10 MG tablet Take 0.5 tablets (5 mg total) by mouth at bedtime as needed for sleep. Patient taking differently: Take 5 mg by mouth at bedtime.  04/03/17  Yes Hongalgi, Lenis Dickinson, MD  FLUoxetine (PROZAC) 20 MG capsule  Take 1 capsule (20 mg total) by mouth daily. Patient not taking: Reported on 04/17/2017 04/04/17   Modena Jansky, MD     Family History  Problem Relation Age of Onset  . Coronary artery disease Mother   . Diabetes Sister     Social History   Social History  . Marital status: Married    Spouse name: Elenore Rota  . Number of children: 2  . Years of education: 12   Occupational History  . retired    Social History Main Topics  . Smoking status: Never Smoker  . Smokeless tobacco: Never Used  . Alcohol use  No  . Drug use: No  . Sexual activity: Yes   Other Topics Concern  . None   Social History Narrative   Right handed    Drinks tea once daily   Soda/coffee sometimes   Lives with husband    Review of Systems: A 12 point ROS discussed and pertinent positives are indicated in the HPI above.  All other systems are negative.  Review of Systems  Constitutional: Positive for activity change and fatigue. Negative for fever and unexpected weight change.  Respiratory: Negative for shortness of breath.   Cardiovascular: Negative for chest pain.  Gastrointestinal: Positive for nausea. Negative for abdominal pain.  Neurological: Negative for weakness.  Psychiatric/Behavioral: Negative for behavioral problems and confusion.    Vital Signs: BP 117/84   Pulse 63   Temp 98.1 F (36.7 C) (Oral)   Resp 16   Ht 5\' 4"  (1.626 m)   Wt 198 lb (89.8 kg)   SpO2 97%   BMI 33.99 kg/m   Physical Exam  Constitutional: She is oriented to person, place, and time. She appears well-nourished.  HENT:  Head: Atraumatic.  Eyes: Scleral icterus is present.  Minimal icterus  Cardiovascular: Regular rhythm.   irreg rate  Pulmonary/Chest: Effort normal and breath sounds normal.  Abdominal: Soft. Bowel sounds are normal. She exhibits no distension. There is no tenderness.  Musculoskeletal: Normal range of motion.  Neurological: She is alert and oriented to person, place, and time.  Skin: Skin is warm and dry.  Slightly yellow tinge to skin  Psychiatric: She has a normal mood and affect. Her behavior is normal. Judgment and thought content normal.  Nursing note and vitals reviewed.   Mallampati Score:  MD Evaluation Airway: WNL Heart: WNL Abdomen: WNL Chest/ Lungs: WNL ASA  Classification: 2 Mallampati/Airway Score: One  Imaging: Ct Abdomen Pelvis W Contrast  Result Date: 04/01/2017 CLINICAL DATA:  66 year old female with transaminitis and possible acute hepatitis EXAM: CT ABDOMEN AND PELVIS  WITH CONTRAST TECHNIQUE: Multidetector CT imaging of the abdomen and pelvis was performed using the standard protocol following bolus administration of intravenous contrast. CONTRAST:  100 mL Isovue 300 % COMPARISON:  Prior CT abdomen/ pelvis 03/15/2003 FINDINGS: Lower chest: Mild dependent atelectasis in the lower lobes. The heart is at the upper limits of normal for size. No pericardial effusion. Small hiatal hernia. No acute abnormality. Hepatobiliary: No focal liver abnormality is seen. Status post cholecystectomy. No biliary dilatation. Pancreas: Unremarkable. No pancreatic ductal dilatation or surrounding inflammatory changes. Spleen: Normal in size without focal abnormality. Adrenals/Urinary Tract: Normal adrenal glands. Symmetric renal parenchymal enhancement bilaterally. No enhancing mass, nephrolithiasis or hydronephrosis. Two simple cysts are noted in the left upper and interpolar kidney. Unremarkable ureters and bladder. Stomach/Bowel: No evidence of obstruction or focal bowel wall thickening. Normal appendix in the right lower quadrant. The terminal ileum is unremarkable. Vascular/Lymphatic:  Minimal atherosclerotic calcifications of the about the aorta. No evidence of aneurysm. The hepatic and portal veins are patent. No suspicious lymphadenopathy. Reproductive: Uterus and bilateral adnexa are unremarkable. Other: No abdominal wall hernia or abnormality. No abdominopelvic ascites. Musculoskeletal: No acute fracture or aggressive appearing lytic or blastic osseous lesion. IMPRESSION: 1. No acute abnormality in the abdomen. Specifically, the CT appearance the liver is unremarkable save for surgical changes of prior cholecystectomy. 2. Borderline cardiomegaly. 3. Simple left side renal cysts. 4.  Aortic Atherosclerosis (ICD10-170.0) Electronically Signed   By: Jacqulynn Cadet M.D.   On: 04/01/2017 18:31    Labs:  CBC:  Recent Labs  04/01/17 1400 04/02/17 0110 04/03/17 0426 04/21/17 0610  WBC  5.6 5.7 5.1 10.0  HGB 13.3 11.8* 11.6* 12.6  HCT 41.0 36.3 35.7* 36.9  PLT 290 244 273 317    COAGS:  Recent Labs  04/01/17 1600  INR 1.17    BMP:  Recent Labs  04/01/17 1355 04/02/17 0110 04/03/17 0426  NA 134* 139 137  K 4.1 4.2 4.2  CL 100* 106 105  CO2 26 28 25   GLUCOSE 120* 138* 106*  BUN 11 8 7   CALCIUM 10.5* 9.1 8.8*  CREATININE 1.14* 1.13* 0.90  GFRNONAA 49* 49* >60  GFRAA 56* 57* >60    LIVER FUNCTION TESTS:  Recent Labs  04/01/17 1355 04/02/17 0755 04/03/17 0426  BILITOT 7.6* 5.9* 7.2*  AST 1,024* 741* 813*  ALT 1,777* 1,326* 1,395*  ALKPHOS 150* 112 127*  PROT 8.3* 7.1 7.1  ALBUMIN 3.6 3.0* 3.0*    TUMOR MARKERS: No results for input(s): AFPTM, CEA, CA199, CHROMGRNA in the last 8760 hours.  Assessment and Plan:  Elevated LFTs Scheduled for liver core biopsy Risks and benefits discussed with the patient including, but not limited to bleeding, infection, damage to adjacent structures or low yield requiring additional tests. All of the patient's questions were answered, patient is agreeable to proceed. Consent signed and in chart.   Thank you for this interesting consult.  I greatly enjoyed meeting Yolanda Oconnell and look forward to participating in their care.  A copy of this report was sent to the requesting provider on this date.  Electronically Signed: Lavonia Drafts, PA-C 04/21/2017, 7:18 AM   I spent a total of  30 Minutes   in face to face in clinical consultation, greater than 50% of which was counseling/coordinating care for liver core bx

## 2017-04-21 NOTE — Sedation Documentation (Signed)
Procedure reviewed with pt and family, questions answered.  Awaiting MD.  Call bell in reach.

## 2017-04-21 NOTE — Procedures (Signed)
Pre Procedure Dx: Elevated LFTs Post Procedural Dx: Same  Technically successful US guided biopsy of right lobe of the liver.  EBL: None  No immediate complications.   Jay Franky Reier, MD Pager #: [phone]    

## 2017-05-04 ENCOUNTER — Encounter (HOSPITAL_COMMUNITY): Payer: Self-pay | Admitting: Emergency Medicine

## 2017-05-04 ENCOUNTER — Emergency Department (HOSPITAL_COMMUNITY)
Admission: EM | Admit: 2017-05-04 | Discharge: 2017-05-05 | Disposition: A | Discharge status: Home / Self Care | Payer: Medicare Other | Attending: Emergency Medicine | Admitting: Emergency Medicine

## 2017-05-04 DIAGNOSIS — Z79899 Other long term (current) drug therapy: Secondary | ICD-10-CM | POA: Diagnosis not present

## 2017-05-04 DIAGNOSIS — N183 Chronic kidney disease, stage 3 (moderate): Secondary | ICD-10-CM | POA: Diagnosis not present

## 2017-05-04 DIAGNOSIS — I129 Hypertensive chronic kidney disease with stage 1 through stage 4 chronic kidney disease, or unspecified chronic kidney disease: Secondary | ICD-10-CM | POA: Diagnosis not present

## 2017-05-04 DIAGNOSIS — Y829 Unspecified medical devices associated with adverse incidents: Secondary | ICD-10-CM | POA: Insufficient documentation

## 2017-05-04 DIAGNOSIS — Z7982 Long term (current) use of aspirin: Secondary | ICD-10-CM | POA: Insufficient documentation

## 2017-05-04 DIAGNOSIS — E785 Hyperlipidemia, unspecified: Secondary | ICD-10-CM | POA: Insufficient documentation

## 2017-05-04 DIAGNOSIS — R739 Hyperglycemia, unspecified: Secondary | ICD-10-CM

## 2017-05-04 DIAGNOSIS — R799 Abnormal finding of blood chemistry, unspecified: Secondary | ICD-10-CM | POA: Insufficient documentation

## 2017-05-04 DIAGNOSIS — R7401 Elevation of levels of liver transaminase levels: Secondary | ICD-10-CM

## 2017-05-04 DIAGNOSIS — T887XXA Unspecified adverse effect of drug or medicament, initial encounter: Secondary | ICD-10-CM | POA: Insufficient documentation

## 2017-05-04 DIAGNOSIS — T380X5A Adverse effect of glucocorticoids and synthetic analogues, initial encounter: Secondary | ICD-10-CM

## 2017-05-04 DIAGNOSIS — T370X5A Adverse effect of sulfonamides, initial encounter: Secondary | ICD-10-CM | POA: Diagnosis present

## 2017-05-04 DIAGNOSIS — R740 Nonspecific elevation of levels of transaminase and lactic acid dehydrogenase [LDH]: Secondary | ICD-10-CM | POA: Insufficient documentation

## 2017-05-04 LAB — CBC WITH DIFFERENTIAL/PLATELET
BASOS ABS: 0 10*3/uL (ref 0.0–0.1)
BASOS PCT: 0 %
EOS ABS: 0 10*3/uL (ref 0.0–0.7)
Eosinophils Relative: 0 %
HCT: 41 % (ref 36.0–46.0)
Hemoglobin: 13.5 g/dL (ref 12.0–15.0)
LYMPHS ABS: 3.4 10*3/uL (ref 0.7–4.0)
Lymphocytes Relative: 31 %
MCH: 31 pg (ref 26.0–34.0)
MCHC: 32.9 g/dL (ref 30.0–36.0)
MCV: 94.3 fL (ref 78.0–100.0)
MONO ABS: 1 10*3/uL (ref 0.1–1.0)
Monocytes Relative: 9 %
NEUTROS ABS: 6.6 10*3/uL (ref 1.7–7.7)
Neutrophils Relative %: 60 %
Platelets: 259 10*3/uL (ref 150–400)
RBC: 4.35 MIL/uL (ref 3.87–5.11)
RDW: 17.7 % — AB (ref 11.5–15.5)
WBC: 11 10*3/uL — ABNORMAL HIGH (ref 4.0–10.5)

## 2017-05-04 LAB — URINALYSIS, ROUTINE W REFLEX MICROSCOPIC
Bilirubin Urine: NEGATIVE
Hgb urine dipstick: NEGATIVE
KETONES UR: NEGATIVE mg/dL
Leukocytes, UA: NEGATIVE
NITRITE: NEGATIVE
PROTEIN: NEGATIVE mg/dL
Specific Gravity, Urine: 1.036 — ABNORMAL HIGH (ref 1.005–1.030)
pH: 6 (ref 5.0–8.0)

## 2017-05-04 LAB — COMPREHENSIVE METABOLIC PANEL
ALBUMIN: 2.7 g/dL — AB (ref 3.5–5.0)
ALK PHOS: 131 U/L — AB (ref 38–126)
ALT: 643 U/L — ABNORMAL HIGH (ref 14–54)
ANION GAP: 9 (ref 5–15)
AST: 204 U/L — ABNORMAL HIGH (ref 15–41)
BILIRUBIN TOTAL: 7.8 mg/dL — AB (ref 0.3–1.2)
BUN: 24 mg/dL — ABNORMAL HIGH (ref 6–20)
CALCIUM: 8.9 mg/dL (ref 8.9–10.3)
CO2: 22 mmol/L (ref 22–32)
Chloride: 95 mmol/L — ABNORMAL LOW (ref 101–111)
Creatinine, Ser: 1.37 mg/dL — ABNORMAL HIGH (ref 0.44–1.00)
GFR calc non Af Amer: 39 mL/min — ABNORMAL LOW (ref >60)
GFR, EST AFRICAN AMERICAN: 45 mL/min — AB (ref >60)
GLUCOSE: 499 mg/dL — AB (ref 65–99)
POTASSIUM: 4.5 mmol/L (ref 3.5–5.1)
SODIUM: 126 mmol/L — AB (ref 135–145)
TOTAL PROTEIN: 6.4 g/dL — AB (ref 6.5–8.1)

## 2017-05-04 LAB — I-STAT VENOUS BLOOD GAS, ED
Acid-Base Excess: 1 mmol/L (ref 0.0–2.0)
Bicarbonate: 25.3 mmol/L (ref 20.0–28.0)
O2 Saturation: 87 %
PH VEN: 7.447 — AB (ref 7.250–7.430)
PO2 VEN: 50 mmHg — AB (ref 32.0–45.0)
Patient temperature: 98.4
TCO2: 26 mmol/L (ref 22–32)
pCO2, Ven: 36.6 mmHg — ABNORMAL LOW (ref 44.0–60.0)

## 2017-05-04 LAB — CBG MONITORING, ED
GLUCOSE-CAPILLARY: 493 mg/dL — AB (ref 65–99)
GLUCOSE-CAPILLARY: 398 mg/dL — AB (ref 65–99)
GLUCOSE-CAPILLARY: 327 mg/dL — AB (ref 65–99)

## 2017-05-04 LAB — BILIRUBIN, DIRECT: Bilirubin, Direct: 3.9 mg/dL — ABNORMAL HIGH (ref 0.1–0.5)

## 2017-05-04 MED ORDER — SODIUM CHLORIDE 0.9 % IV BOLUS (SEPSIS)
1000.0000 mL | Freq: Once | INTRAVENOUS | Status: AC
  Administered 2017-05-04: 1000 mL via INTRAVENOUS

## 2017-05-04 NOTE — ED Triage Notes (Addendum)
Pt was sent from Dr. Watt Climes for further management of glucose >600 in the office today. The patient has type 2 diabetes and has been taking prednisone recently for autoimmune hepatitis with increasing blood sugars. Dr Watt Climes can be reached at [phone] to discuss patient case.

## 2017-05-04 NOTE — ED Notes (Signed)
ED Provider at bedside. 

## 2017-05-04 NOTE — ED Notes (Signed)
Per main lab will add on bilirubin.

## 2017-05-04 NOTE — ED Triage Notes (Addendum)
Patient advised by Dr. 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 CBG = 493 at triage.

## 2017-05-04 NOTE — ED Provider Notes (Signed)
[facility] DEPT Provider Note   CSN: [id number] Arrival date & time: 05/04/17  1850     History   Chief Complaint Chief Complaint  Patient presents with  . Hyperglycemia    HPI ANALIZ TVEDT is a 66 y.o. female.  The history is provided by the patient.  Hyperglycemia  Blood sugar level PTA:  >700 Severity:  Moderate Onset quality:  Gradual Timing:  Intermittent Progression:  Waxing and waning Chronicity:  New Current diabetic treatments: preDM. Context comment:  On Prednisone Relieved by:  None tried Ineffective treatments:  None tried Associated symptoms: fatigue   Associated symptoms: no abdominal pain, no chest pain, no confusion, no dehydration, no diaphoresis, no fever, no malaise, no nausea, no shortness of breath, no syncope and no vomiting   Risk factors: recent steroid use    Patient was recently diagnosed with hepatic toxicity from likely medications that she was on, the patient is unsure which. She is currently being treated with high-dose steroids for her hepatitis.   Past Medical History:  Diagnosis Date  . Arthritis    arthritis-knees, shoulders  . Environmental allergies   . GERD (gastroesophageal reflux disease)   . Hyperlipidemia   . Hypertension   . Hypothyroid   . Neurological symptoms    R-sided pain/numbness/tingling/cold sensation in foot; arm and leg pain periodically - she is seeing a neurologist for this  . PONV (postoperative nausea and vomiting)   . Sarcoidosis 2004   tested positive-no active disease    Patient Active Problem List   Diagnosis Date Noted  . Acute hepatitis 04/03/2017  . CKD (chronic kidney disease) stage 3, GFR 30-59 ml/min 04/02/2017  . Transaminitis 04/01/2017  . Hypothyroidism 04/01/2017  . Essential hypertension 04/01/2017  . Hyperlipidemia 04/01/2017  . Hyperglycemia 04/01/2017  . Neck pain 03/25/2017  . Paresthesia 03/25/2017  . Tremor, essential 03/25/2017  . Cough 12/21/2011    Past Surgical  History:  Procedure Laterality Date  . cerivcal polyp removal  10-1999   Dr Phineas Real  . COLONOSCOPY WITH PROPOFOL N/A 05/13/2016   Procedure: COLONOSCOPY WITH PROPOFOL;  Surgeon: Garlan Fair, MD;  Location: [facility];  Service: Endoscopy;  Laterality: N/A;  . herniated disc surgery  07/1994   Dr Roy-cervical  . Laparoscopic Gallbladder Surgery  12-1999   Dr Neldon Mc  . MEDIASTINOSCOPY  2004   granulomatous adenopathy c/w sarcoid  . PUBOVAGINAL SLING N/A 01/16/2015   Procedure: Margarita Grizzle SPARC;  Surgeon: Bjorn Loser, MD;  Location: [facility];  Service: Urology;  Laterality: N/A;  . Right Rotator Cuff Surgery  [phone]; [phone]   Dr Durward Fortes  . Thumb Surgery Right 04/1998   Dr Yates Decamp cracked  . TUBAL LIGATION  12-1976   Dr Manus Rudd    OB History    No data available       Home Medications    Prior to Admission medications   Medication Sig Start Date End Date Taking? Authorizing Provider  acetaminophen (TYLENOL) 500 MG tablet Take 500 mg by mouth every 8 (eight) hours as needed (for pain or headaches).    Yes [provider]  ALPRAZolam Duanne Moron) 0.5 MG tablet Take 0.5 mg by mouth 2 (two) times daily as needed for anxiety.    Yes [provider]  ASPIRIN 81 PO Take 81 mg by mouth daily.    Yes [provider]  diphenhydrAMINE (BENADRYL) 25 MG tablet Take 12.5 mg by mouth 2 (two) times daily as needed (for rashes).  Yes [provider]  docusate sodium (COLACE) 100 MG capsule Take 100 mg by mouth daily as needed for mild constipation.   Yes [provider]  esomeprazole (NEXIUM) 20 MG capsule Take 20 mg by mouth every morning.   Yes [provider]  famotidine (PEPCID) 20 MG tablet Take 1 tablet (20 mg total) by mouth daily. Patient taking differently: Take 40 mg by mouth at bedtime.  04/03/17  Yes Hongalgi, Lenis Dickinson, MD  levothyroxine (SYNTHROID, LEVOTHROID) 75 MCG tablet Take 75 mcg by  mouth daily before breakfast.    Yes [provider]  predniSONE (DELTASONE) 20 MG tablet Take 30 mg by mouth 2 (two) times daily with a meal.  04/15/17  Yes [provider]  zolpidem (AMBIEN) 10 MG tablet Take 0.5 tablets (5 mg total) by mouth at bedtime as needed for sleep. Patient taking differently: Take 5 mg by mouth at bedtime.  04/03/17  Yes Hongalgi, Lenis Dickinson, MD    Family History Family History  Problem Relation Age of Onset  . Coronary artery disease Mother   . Diabetes Sister     Social History Social History  Substance Use Topics  . Smoking status: Never Smoker  . Smokeless tobacco: Never Used  . Alcohol use No     Allergies   Compazine   Review of Systems Review of Systems  Constitutional: Positive for fatigue. Negative for diaphoresis and fever.  Respiratory: Negative for shortness of breath.   Cardiovascular: Negative for chest pain and syncope.  Gastrointestinal: Negative for abdominal pain, nausea and vomiting.  Psychiatric/Behavioral: Negative for confusion.   All other systems are reviewed and are negative for acute change except as noted in the HPI   Physical Exam Updated Vital Signs BP (!) 120/106 (BP Location: Left Arm)   Pulse 85   Temp 99.3 F (37.4 C) (Oral)   Resp 20   SpO2 99%   Physical Exam  Constitutional: She is oriented to person, place, and time. She appears well-developed and well-nourished. No distress.  HENT:  Head: Normocephalic and atraumatic.  Nose: Nose normal.  Eyes: Pupils are equal, round, and reactive to light. Conjunctivae and EOM are normal. Right eye exhibits no discharge. Left eye exhibits no discharge. Scleral icterus is present.  Neck: Normal range of motion. Neck supple.  Cardiovascular: Normal rate and regular rhythm.  Exam reveals no gallop and no friction rub.   No murmur heard. Pulmonary/Chest: Effort normal and breath sounds normal. No stridor. No respiratory distress. She has no rales.    Abdominal: Soft. She exhibits no distension. There is no tenderness.  Musculoskeletal: She exhibits no edema or tenderness.  Neurological: She is alert and oriented to person, place, and time.  Skin: Skin is warm and dry. No rash noted. She is not diaphoretic. No erythema.  Psychiatric: She has a normal mood and affect.  Vitals reviewed.    ED Treatments / Results  Labs (all labs ordered are listed, but only abnormal results are displayed) Labs Reviewed  CBC WITH DIFFERENTIAL/PLATELET - Abnormal; Notable for the following:       Result Value   WBC 11.0 (*)    RDW 17.7 (*)    All other components within normal limits  COMPREHENSIVE METABOLIC PANEL - Abnormal; Notable for the following:    Sodium 126 (*)    Chloride 95 (*)    Glucose, Bld 499 (*)    BUN 24 (*)    Creatinine, Ser 1.37 (*)  Total Protein 6.4 (*)    Albumin 2.7 (*)    AST 204 (*)    ALT 643 (*)    Alkaline Phosphatase 131 (*)    Total Bilirubin 7.8 (*)    GFR calc non Af Amer 39 (*)    GFR calc Af Amer 45 (*)    All other components within normal limits  URINALYSIS, ROUTINE W REFLEX MICROSCOPIC - Abnormal; Notable for the following:    Color, Urine AMBER (*)    Specific Gravity, Urine 1.036 (*)    Glucose, UA >=500 (*)    All other components within normal limits  BILIRUBIN, DIRECT - Abnormal; Notable for the following:    Bilirubin, Direct 3.9 (*)    All other components within normal limits  CBG MONITORING, ED - Abnormal; Notable for the following:    Glucose-Capillary 493 (*)    All other components within normal limits  I-STAT VENOUS BLOOD GAS, ED - Abnormal; Notable for the following:    pH, Ven 7.447 (*)    pCO2, Ven 36.6 (*)    pO2, Ven 50.0 (*)    All other components within normal limits  CBG MONITORING, ED - Abnormal; Notable for the following:    Glucose-Capillary 398 (*)    All other components within normal limits  CBG MONITORING, ED - Abnormal; Notable for the following:     Glucose-Capillary 327 (*)    All other components within normal limits  BLOOD GAS, VENOUS    EKG  EKG Interpretation None       Radiology No results found.  Procedures Procedures (including critical care time)  Medications Ordered in ED Medications  sodium chloride 0.9 % bolus 1,000 mL (0 mLs Intravenous Stopped 05/04/17 2139)  sodium chloride 0.9 % bolus 1,000 mL (1,000 mLs Intravenous New Bag/Given 05/04/17 2245)     Initial Impression / Assessment and Plan / ED Course  I have reviewed the triage vital signs and the nursing notes.  Pertinent labs & imaging results that were available during my care of the patient were reviewed by me and considered in my medical decision making (see chart for details).     Patient does have hyperglycemia secondary from steroid use without evidence of DKA or HHS.no recent infections. Improving with IV fluids.  Transaminitis improving, from prior labs.  Hyperglycemia improved with IV fluids. Recommended close follow-up with PCP in one to 2 days.  The patient is safe for discharge with strict return precautions.   Final Clinical Impressions(s) / ED Diagnoses   Final diagnoses:  Steroid-induced hyperglycemia  Transaminitis   Disposition: Discharge  Condition: Good  I have discussed the results, Dx and Tx plan with the patient who expressed understanding and agree(s) with the plan. Discharge instructions discussed at great length. The patient was given strict return precautions who verbalized understanding of the instructions. No further questions at time of discharge.    New Prescriptions   No medications on file    Follow Up: Carol Ada, MD [address] [place] [address] [phone]  Schedule an appointment as soon as possible for a visit  In 1-2 days for continued management      Jonnathan Birman, Grayce Sessions, MD 05/05/17 [phone]

## 2017-09-30 ENCOUNTER — Ambulatory Visit: Payer: Medicare Other | Admitting: Neurology

## 2017-10-12 ENCOUNTER — Other Ambulatory Visit: Payer: Self-pay | Admitting: Family Medicine

## 2017-10-12 ENCOUNTER — Ambulatory Visit
Admission: RE | Admit: 2017-10-12 | Discharge: 2017-10-12 | Disposition: A | Discharge status: Home / Self Care | Payer: Medicare Other | Source: Ambulatory Visit | Attending: Family Medicine | Admitting: Family Medicine

## 2017-10-12 DIAGNOSIS — R059 Cough, unspecified: Secondary | ICD-10-CM

## 2017-10-12 DIAGNOSIS — R05 Cough: Secondary | ICD-10-CM

## 2018-01-26 ENCOUNTER — Other Ambulatory Visit: Payer: Self-pay | Admitting: Family Medicine

## 2018-01-26 DIAGNOSIS — Z1231 Encounter for screening mammogram for malignant neoplasm of breast: Secondary | ICD-10-CM

## 2018-02-25 ENCOUNTER — Ambulatory Visit: Payer: Medicare Other

## 2018-03-17 ENCOUNTER — Ambulatory Visit
Admission: RE | Admit: 2018-03-17 | Discharge: 2018-03-17 | Disposition: A | Discharge status: Home / Self Care | Payer: Medicare Other | Source: Ambulatory Visit | Attending: Family Medicine | Admitting: Family Medicine

## 2018-03-17 DIAGNOSIS — Z1231 Encounter for screening mammogram for malignant neoplasm of breast: Secondary | ICD-10-CM

## 2018-07-15 IMAGING — US US BIOPSY
1 series · 13 of 16 positions shown · non-contrast
Comparison: CT abdomen and pelvis - 04/01/2017

INDICATION: Elevated LFTs of uncertain etiology. Please perform
ultrasound-guided liver biopsy for tissue diagnostic purposes.

EXAM:
ULTRASOUND GUIDED LIVER BIOPSY

[Series 1: us biopsy · 0.26mm/px · 13 of 16 slices shown]
[im 1/16]
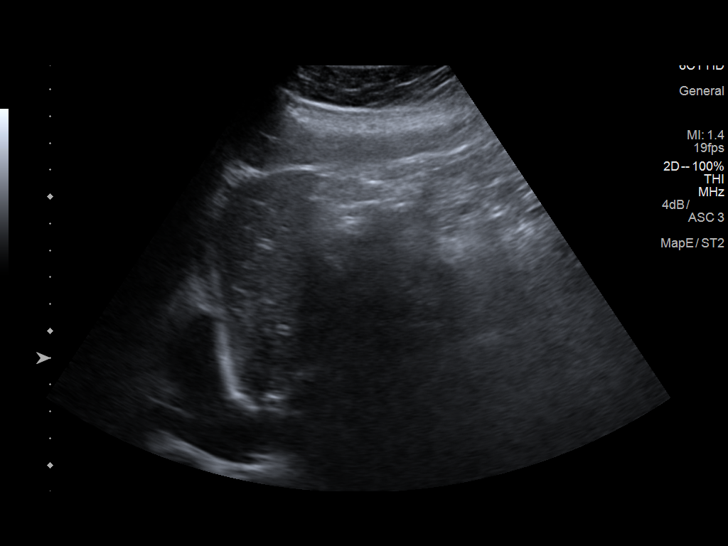
[im 2/16]
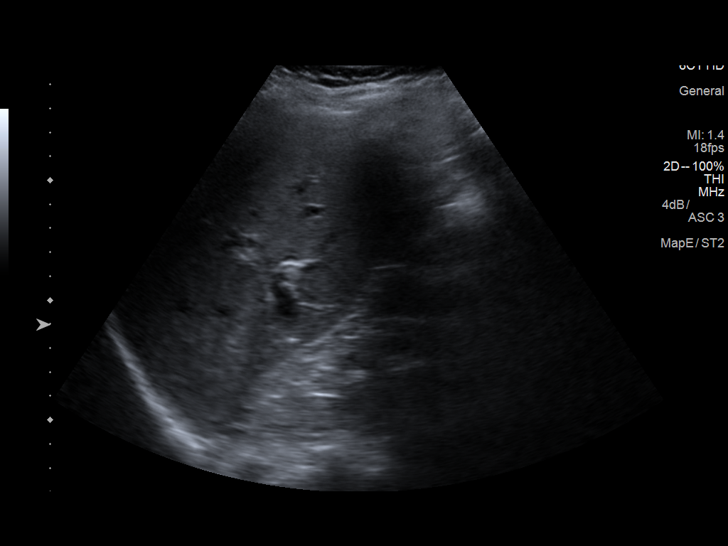
[im 4/16]
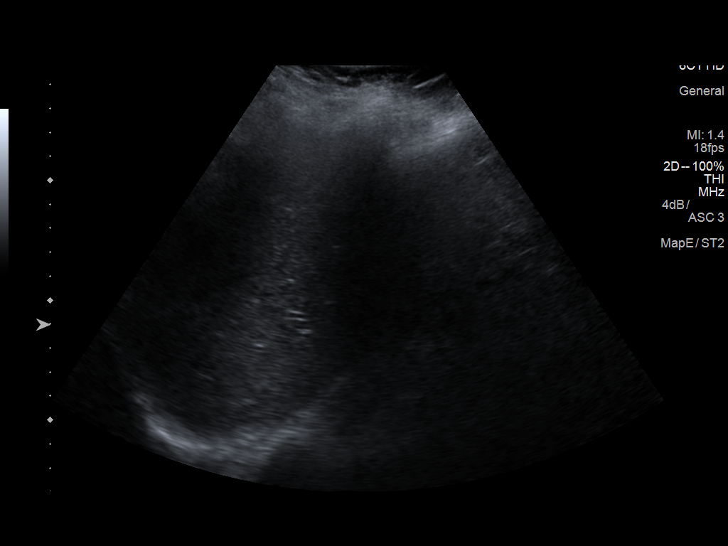
[im 5/16]
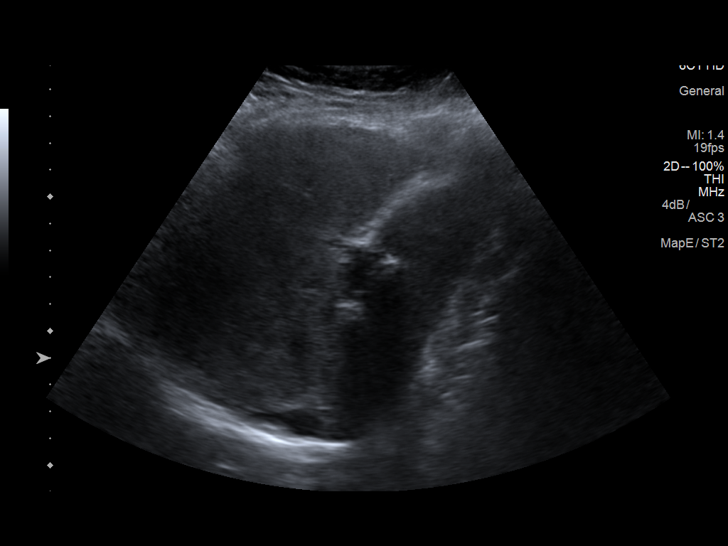
[im 6/16]
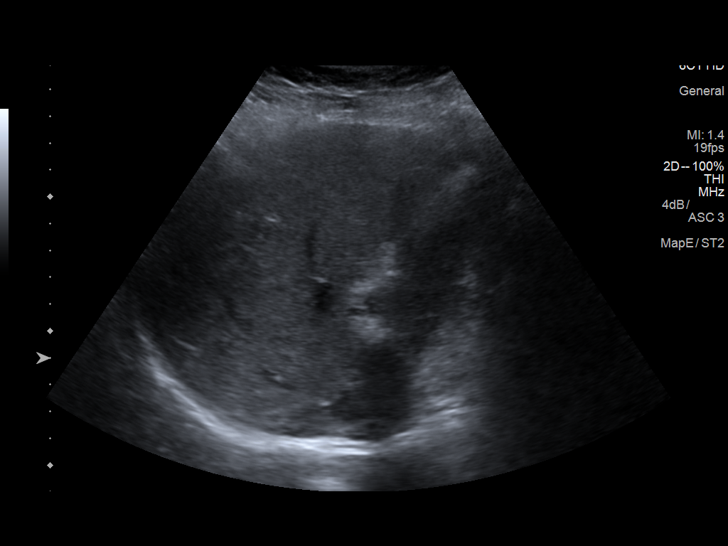
[im 7/16]
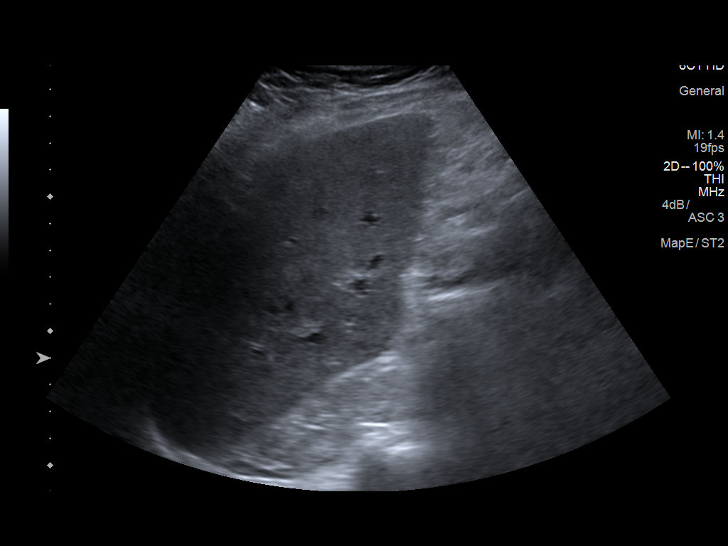
[im 9/16]
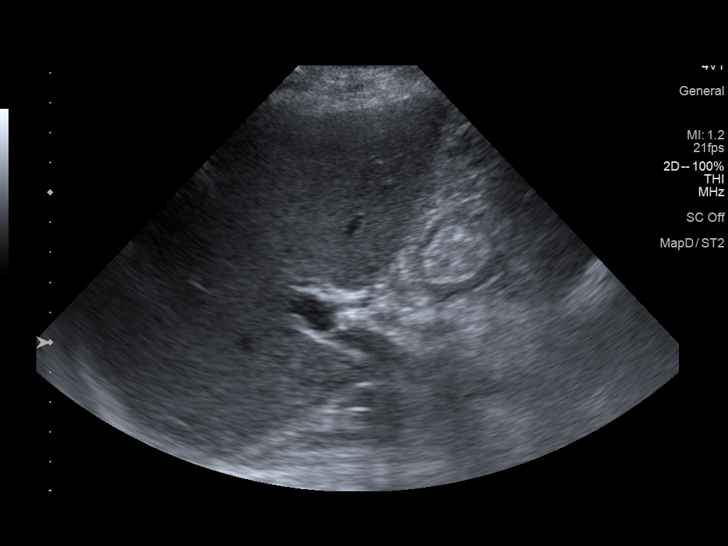
[im 10/16]
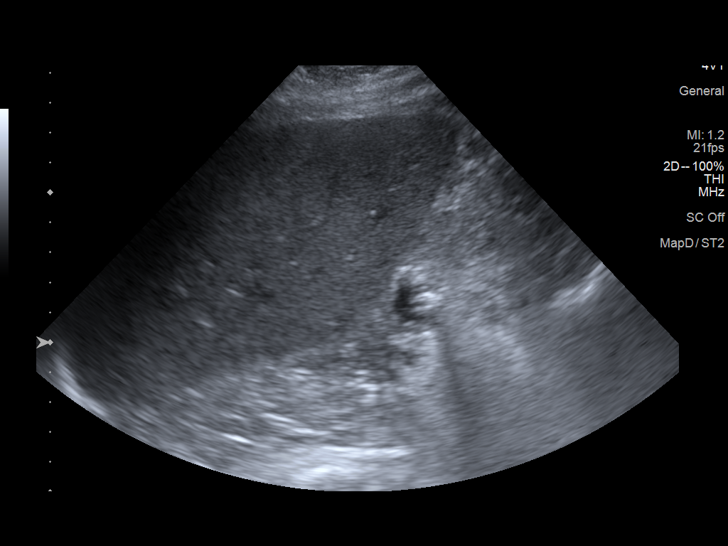
[im 11/16]
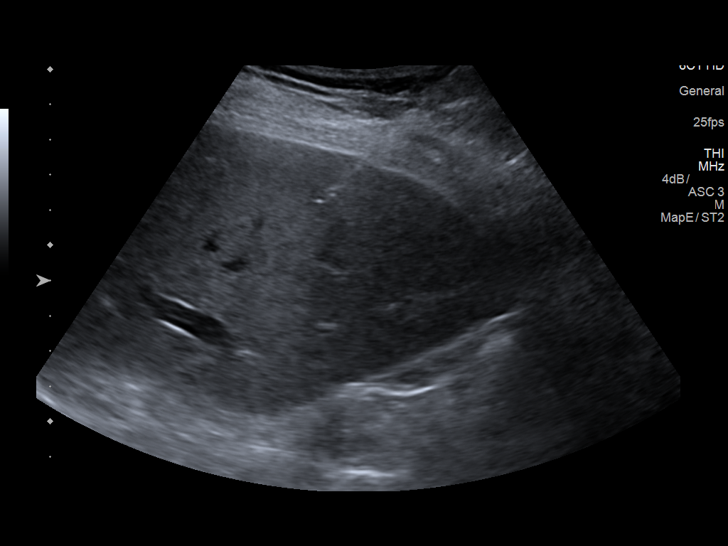
[im 12/16]
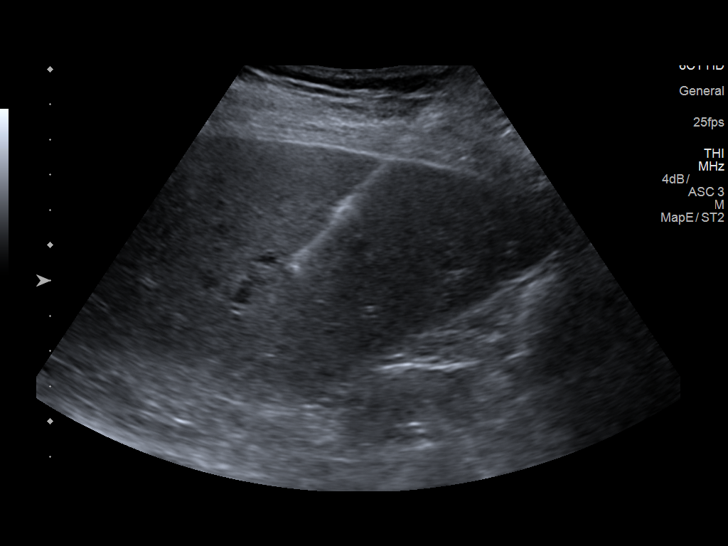
[im 13/16]
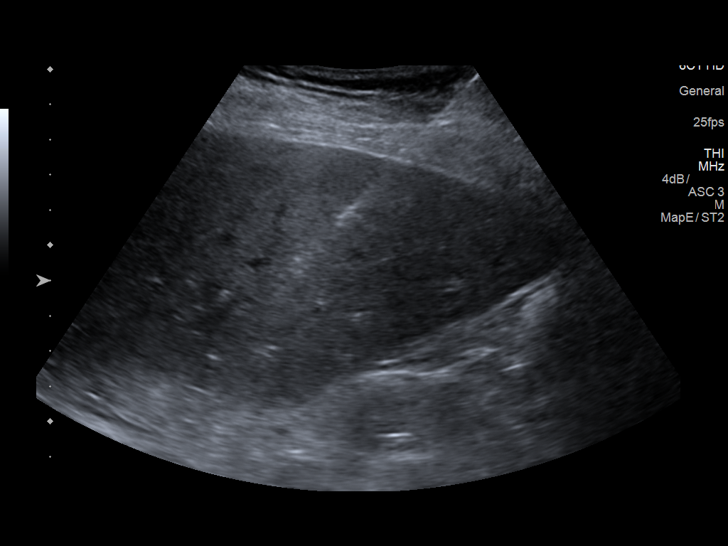
[im 15/16]
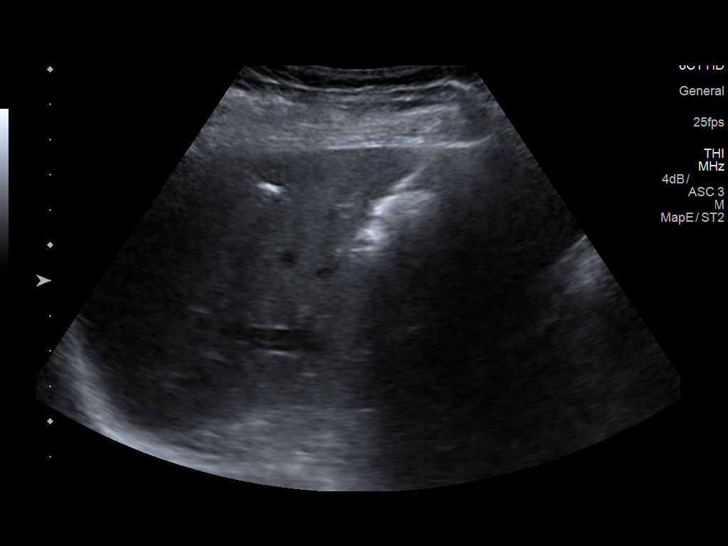
[im 16/16]
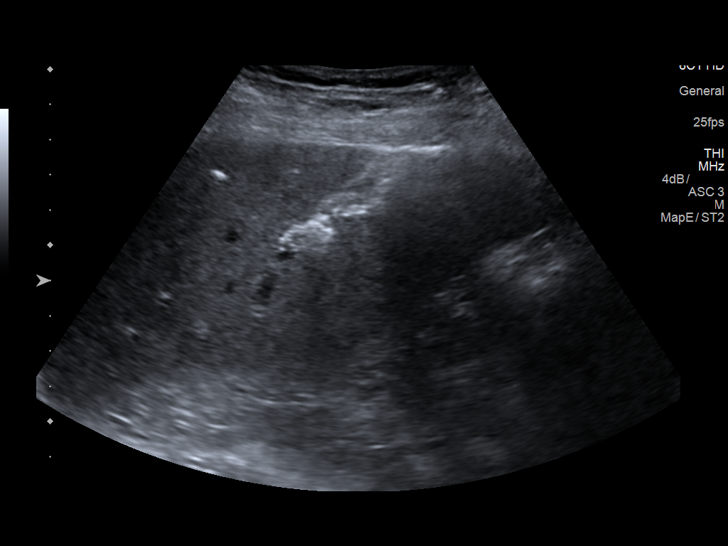

[13 of 16 positions shown; findings below may reference images not displayed]

MEDICATIONS:
None

ANESTHESIA/SEDATION:
Fentanyl 100 mcg IV; Versed 2 mg IV

Total Moderate Sedation time: 10 minutes; The patient was
continuously monitored during the procedure by the interventional
radiology nurse under my direct supervision.

COMPLICATIONS:
None immediate.

PROCEDURE:
Informed written consent was obtained from the patient after a
discussion of the risks, benefits and alternatives to treatment. The
patient understands and consents the procedure. A timeout was
performed prior to the initiation of the procedure.

Ultrasound scanning was performed of the right upper abdominal
quadrant and the procedure was planned. The right upper abdomen was
prepped and draped in the usual sterile fashion. The overlying soft
tissues were anesthetized with 1% lidocaine with epinephrine. A 17
gauge, 6.8 cm co-axial needle was advanced into a peripheral aspect
of the right lobe of the liver and 3 core biopsies were obtained
with an 18 gauge core device under direct ultrasound guidance.

The co-axial needle track was embolized with the administration of a
Gel-Foam slurry. Superficial hemostasis was obtained with manual
compression. Post procedural scanning was negative for definitive
area of hemorrhage. A dressing was placed. The patient tolerated the
procedure well without immediate post procedural complication.
IMPRESSION: Technically successful ultrasound guided liver biopsy.

## 2019-06-21 ENCOUNTER — Other Ambulatory Visit: Payer: Self-pay | Admitting: Family Medicine

## 2019-06-21 DIAGNOSIS — Z1231 Encounter for screening mammogram for malignant neoplasm of breast: Secondary | ICD-10-CM

## 2019-08-08 ENCOUNTER — Ambulatory Visit: Payer: Medicare Other

## 2019-09-30 ENCOUNTER — Other Ambulatory Visit: Payer: Self-pay

## 2019-09-30 ENCOUNTER — Ambulatory Visit
Admission: RE | Admit: 2019-09-30 | Discharge: 2019-09-30 | Disposition: A | Discharge status: Home / Self Care | Payer: Medicare Other | Source: Ambulatory Visit | Attending: Family Medicine | Admitting: Family Medicine

## 2019-09-30 DIAGNOSIS — Z1231 Encounter for screening mammogram for malignant neoplasm of breast: Secondary | ICD-10-CM

## 2019-10-22 ENCOUNTER — Ambulatory Visit: Payer: Medicare Other

## 2019-10-22 ENCOUNTER — Ambulatory Visit: Payer: Medicare Other | Attending: Internal Medicine

## 2019-10-22 DIAGNOSIS — Z23 Encounter for immunization: Secondary | ICD-10-CM | POA: Insufficient documentation

## 2019-10-22 NOTE — Progress Notes (Signed)
   Covid-19 Vaccination Clinic  Name:  Yolanda Oconnell    MRN: [id number] DOB: [birth year]  10/22/2019  Yolanda Oconnell was observed post Covid-19 immunization for 15 minutes without incidence. She was provided with Vaccine Information Sheet and instruction to access the V-Safe system.   Yolanda Oconnell was instructed to call 911 with any severe reactions post vaccine: Marland Kitchen Difficulty breathing  . Swelling of your face and throat  . A fast heartbeat  . A bad rash all over your body  . Dizziness and weakness    Immunizations Administered    Name Date Dose VIS Date Route   Pfizer COVID-19 Vaccine 10/22/2019  9:05 AM 0.3 mL 08/19/2019 Intramuscular   Manufacturer: [place]   Lot: [id number]   [place]: [id number]

## 2019-11-13 ENCOUNTER — Ambulatory Visit: Payer: Medicare Other | Attending: Internal Medicine

## 2019-11-13 DIAGNOSIS — Z23 Encounter for immunization: Secondary | ICD-10-CM | POA: Insufficient documentation

## 2019-11-13 NOTE — Progress Notes (Signed)
   Covid-19 Vaccination Clinic  Name:  Yolanda Oconnell    MRN: [id number] DOB: 1950/10/02  11/13/2019  Yolanda Oconnell was observed post Covid-19 immunization for 15 minutes without incident. She was provided with Vaccine Information Sheet and instruction to access the V-Safe system.   Yolanda Oconnell was instructed to call 911 with any severe reactions post vaccine: Marland Kitchen Difficulty breathing  . Swelling of face and throat  . A fast heartbeat  . A bad rash all over body  . Dizziness and weakness   Immunizations Administered    Name Date Dose VIS Date Route   Pfizer COVID-19 Vaccine 11/13/2019  1:23 PM 0.3 mL 08/19/2019 Intramuscular   Manufacturer: [place]   Lot: [id number]   [place]: [id number]

## 2020-09-19 DIAGNOSIS — N183 Chronic kidney disease, stage 3 unspecified: Secondary | ICD-10-CM | POA: Diagnosis not present

## 2020-09-19 DIAGNOSIS — E039 Hypothyroidism, unspecified: Secondary | ICD-10-CM | POA: Diagnosis not present

## 2020-09-19 DIAGNOSIS — L245 Irritant contact dermatitis due to other chemical products: Secondary | ICD-10-CM | POA: Diagnosis not present

## 2020-09-19 DIAGNOSIS — K219 Gastro-esophageal reflux disease without esophagitis: Secondary | ICD-10-CM | POA: Diagnosis not present

## 2020-09-19 DIAGNOSIS — L700 Acne vulgaris: Secondary | ICD-10-CM | POA: Diagnosis not present

## 2020-09-19 DIAGNOSIS — I10 Essential (primary) hypertension: Secondary | ICD-10-CM | POA: Diagnosis not present

## 2020-09-19 DIAGNOSIS — E7800 Pure hypercholesterolemia, unspecified: Secondary | ICD-10-CM | POA: Diagnosis not present

## 2020-09-19 DIAGNOSIS — E1122 Type 2 diabetes mellitus with diabetic chronic kidney disease: Secondary | ICD-10-CM | POA: Diagnosis not present

## 2020-09-28 ENCOUNTER — Other Ambulatory Visit: Payer: Self-pay | Admitting: Family Medicine

## 2020-09-28 DIAGNOSIS — Z1231 Encounter for screening mammogram for malignant neoplasm of breast: Secondary | ICD-10-CM

## 2020-10-18 ENCOUNTER — Inpatient Hospital Stay: Admission: RE | Admit: 2020-10-18 | Payer: Medicare Other | Source: Ambulatory Visit

## 2020-10-18 DIAGNOSIS — Z1231 Encounter for screening mammogram for malignant neoplasm of breast: Secondary | ICD-10-CM

## 2020-11-05 ENCOUNTER — Other Ambulatory Visit: Payer: Self-pay | Admitting: Family Medicine

## 2020-11-05 DIAGNOSIS — Z1231 Encounter for screening mammogram for malignant neoplasm of breast: Secondary | ICD-10-CM

## 2020-12-04 DIAGNOSIS — E1122 Type 2 diabetes mellitus with diabetic chronic kidney disease: Secondary | ICD-10-CM | POA: Diagnosis not present

## 2020-12-04 DIAGNOSIS — M8580 Other specified disorders of bone density and structure, unspecified site: Secondary | ICD-10-CM | POA: Diagnosis not present

## 2020-12-04 DIAGNOSIS — K219 Gastro-esophageal reflux disease without esophagitis: Secondary | ICD-10-CM | POA: Diagnosis not present

## 2020-12-04 DIAGNOSIS — E099 Drug or chemical induced diabetes mellitus without complications: Secondary | ICD-10-CM | POA: Diagnosis not present

## 2020-12-04 DIAGNOSIS — I10 Essential (primary) hypertension: Secondary | ICD-10-CM | POA: Diagnosis not present

## 2020-12-04 DIAGNOSIS — G4700 Insomnia, unspecified: Secondary | ICD-10-CM | POA: Diagnosis not present

## 2020-12-04 DIAGNOSIS — E782 Mixed hyperlipidemia: Secondary | ICD-10-CM | POA: Diagnosis not present

## 2020-12-04 DIAGNOSIS — E7800 Pure hypercholesterolemia, unspecified: Secondary | ICD-10-CM | POA: Diagnosis not present

## 2020-12-04 DIAGNOSIS — N183 Chronic kidney disease, stage 3 unspecified: Secondary | ICD-10-CM | POA: Diagnosis not present

## 2020-12-04 DIAGNOSIS — E039 Hypothyroidism, unspecified: Secondary | ICD-10-CM | POA: Diagnosis not present

## 2020-12-19 ENCOUNTER — Ambulatory Visit
Admission: RE | Admit: 2020-12-19 | Discharge: 2020-12-19 | Disposition: A | Discharge status: Home / Self Care | Payer: Medicare Other | Source: Ambulatory Visit

## 2020-12-19 ENCOUNTER — Other Ambulatory Visit: Payer: Self-pay

## 2020-12-19 DIAGNOSIS — Z1231 Encounter for screening mammogram for malignant neoplasm of breast: Secondary | ICD-10-CM

## 2021-03-21 DIAGNOSIS — E039 Hypothyroidism, unspecified: Secondary | ICD-10-CM | POA: Diagnosis not present

## 2021-03-21 DIAGNOSIS — E1122 Type 2 diabetes mellitus with diabetic chronic kidney disease: Secondary | ICD-10-CM | POA: Diagnosis not present

## 2021-03-21 DIAGNOSIS — E7800 Pure hypercholesterolemia, unspecified: Secondary | ICD-10-CM | POA: Diagnosis not present

## 2021-03-28 DIAGNOSIS — Z1389 Encounter for screening for other disorder: Secondary | ICD-10-CM | POA: Diagnosis not present

## 2021-03-28 DIAGNOSIS — I10 Essential (primary) hypertension: Secondary | ICD-10-CM | POA: Diagnosis not present

## 2021-03-28 DIAGNOSIS — R748 Abnormal levels of other serum enzymes: Secondary | ICD-10-CM | POA: Diagnosis not present

## 2021-03-28 DIAGNOSIS — N183 Chronic kidney disease, stage 3 unspecified: Secondary | ICD-10-CM | POA: Diagnosis not present

## 2021-03-28 DIAGNOSIS — E039 Hypothyroidism, unspecified: Secondary | ICD-10-CM | POA: Diagnosis not present

## 2021-03-28 DIAGNOSIS — E559 Vitamin D deficiency, unspecified: Secondary | ICD-10-CM | POA: Diagnosis not present

## 2021-03-28 DIAGNOSIS — Z0000 Encounter for general adult medical examination without abnormal findings: Secondary | ICD-10-CM | POA: Diagnosis not present

## 2021-03-28 DIAGNOSIS — R350 Frequency of micturition: Secondary | ICD-10-CM | POA: Diagnosis not present

## 2021-03-28 DIAGNOSIS — Z7984 Long term (current) use of oral hypoglycemic drugs: Secondary | ICD-10-CM | POA: Diagnosis not present

## 2021-03-28 DIAGNOSIS — E1122 Type 2 diabetes mellitus with diabetic chronic kidney disease: Secondary | ICD-10-CM | POA: Diagnosis not present

## 2021-03-28 DIAGNOSIS — E7800 Pure hypercholesterolemia, unspecified: Secondary | ICD-10-CM | POA: Diagnosis not present

## 2021-04-01 ENCOUNTER — Other Ambulatory Visit: Payer: Self-pay | Admitting: Family Medicine

## 2021-04-01 DIAGNOSIS — G4700 Insomnia, unspecified: Secondary | ICD-10-CM | POA: Diagnosis not present

## 2021-04-01 DIAGNOSIS — N183 Chronic kidney disease, stage 3 unspecified: Secondary | ICD-10-CM | POA: Diagnosis not present

## 2021-04-01 DIAGNOSIS — I10 Essential (primary) hypertension: Secondary | ICD-10-CM | POA: Diagnosis not present

## 2021-04-01 DIAGNOSIS — E039 Hypothyroidism, unspecified: Secondary | ICD-10-CM | POA: Diagnosis not present

## 2021-04-01 DIAGNOSIS — R748 Abnormal levels of other serum enzymes: Secondary | ICD-10-CM

## 2021-04-01 DIAGNOSIS — E7800 Pure hypercholesterolemia, unspecified: Secondary | ICD-10-CM | POA: Diagnosis not present

## 2021-04-01 DIAGNOSIS — E1122 Type 2 diabetes mellitus with diabetic chronic kidney disease: Secondary | ICD-10-CM | POA: Diagnosis not present

## 2021-04-01 DIAGNOSIS — E099 Drug or chemical induced diabetes mellitus without complications: Secondary | ICD-10-CM | POA: Diagnosis not present

## 2021-04-01 DIAGNOSIS — M8580 Other specified disorders of bone density and structure, unspecified site: Secondary | ICD-10-CM | POA: Diagnosis not present

## 2021-04-01 DIAGNOSIS — E782 Mixed hyperlipidemia: Secondary | ICD-10-CM | POA: Diagnosis not present

## 2021-04-01 DIAGNOSIS — K219 Gastro-esophageal reflux disease without esophagitis: Secondary | ICD-10-CM | POA: Diagnosis not present

## 2021-04-15 ENCOUNTER — Ambulatory Visit
Admission: RE | Admit: 2021-04-15 | Discharge: 2021-04-15 | Disposition: A | Discharge status: Home / Self Care | Payer: Medicare Other | Source: Ambulatory Visit | Attending: Family Medicine | Admitting: Family Medicine

## 2021-04-15 DIAGNOSIS — K760 Fatty (change of) liver, not elsewhere classified: Secondary | ICD-10-CM | POA: Diagnosis not present

## 2021-04-15 DIAGNOSIS — R945 Abnormal results of liver function studies: Secondary | ICD-10-CM | POA: Diagnosis not present

## 2021-04-15 DIAGNOSIS — R748 Abnormal levels of other serum enzymes: Secondary | ICD-10-CM

## 2021-04-24 DIAGNOSIS — D3141 Benign neoplasm of right ciliary body: Secondary | ICD-10-CM | POA: Diagnosis not present

## 2021-04-24 DIAGNOSIS — H5213 Myopia, bilateral: Secondary | ICD-10-CM | POA: Diagnosis not present

## 2021-04-24 DIAGNOSIS — E119 Type 2 diabetes mellitus without complications: Secondary | ICD-10-CM | POA: Diagnosis not present

## 2021-04-25 DIAGNOSIS — R748 Abnormal levels of other serum enzymes: Secondary | ICD-10-CM | POA: Diagnosis not present

## 2021-05-31 DIAGNOSIS — E782 Mixed hyperlipidemia: Secondary | ICD-10-CM | POA: Diagnosis not present

## 2021-05-31 DIAGNOSIS — E7800 Pure hypercholesterolemia, unspecified: Secondary | ICD-10-CM | POA: Diagnosis not present

## 2021-05-31 DIAGNOSIS — E039 Hypothyroidism, unspecified: Secondary | ICD-10-CM | POA: Diagnosis not present

## 2021-05-31 DIAGNOSIS — K219 Gastro-esophageal reflux disease without esophagitis: Secondary | ICD-10-CM | POA: Diagnosis not present

## 2021-05-31 DIAGNOSIS — N183 Chronic kidney disease, stage 3 unspecified: Secondary | ICD-10-CM | POA: Diagnosis not present

## 2021-05-31 DIAGNOSIS — E1122 Type 2 diabetes mellitus with diabetic chronic kidney disease: Secondary | ICD-10-CM | POA: Diagnosis not present

## 2021-05-31 DIAGNOSIS — G4700 Insomnia, unspecified: Secondary | ICD-10-CM | POA: Diagnosis not present

## 2021-05-31 DIAGNOSIS — I10 Essential (primary) hypertension: Secondary | ICD-10-CM | POA: Diagnosis not present

## 2021-05-31 DIAGNOSIS — M8580 Other specified disorders of bone density and structure, unspecified site: Secondary | ICD-10-CM | POA: Diagnosis not present

## 2021-05-31 DIAGNOSIS — E099 Drug or chemical induced diabetes mellitus without complications: Secondary | ICD-10-CM | POA: Diagnosis not present

## 2021-06-11 DIAGNOSIS — E1122 Type 2 diabetes mellitus with diabetic chronic kidney disease: Secondary | ICD-10-CM | POA: Diagnosis not present

## 2021-06-11 DIAGNOSIS — E099 Drug or chemical induced diabetes mellitus without complications: Secondary | ICD-10-CM | POA: Diagnosis not present

## 2021-06-11 DIAGNOSIS — E039 Hypothyroidism, unspecified: Secondary | ICD-10-CM | POA: Diagnosis not present

## 2021-06-11 DIAGNOSIS — G4700 Insomnia, unspecified: Secondary | ICD-10-CM | POA: Diagnosis not present

## 2021-06-11 DIAGNOSIS — E7800 Pure hypercholesterolemia, unspecified: Secondary | ICD-10-CM | POA: Diagnosis not present

## 2021-06-11 DIAGNOSIS — E782 Mixed hyperlipidemia: Secondary | ICD-10-CM | POA: Diagnosis not present

## 2021-06-11 DIAGNOSIS — I10 Essential (primary) hypertension: Secondary | ICD-10-CM | POA: Diagnosis not present

## 2021-06-11 DIAGNOSIS — K219 Gastro-esophageal reflux disease without esophagitis: Secondary | ICD-10-CM | POA: Diagnosis not present

## 2021-06-11 DIAGNOSIS — N183 Chronic kidney disease, stage 3 unspecified: Secondary | ICD-10-CM | POA: Diagnosis not present

## 2021-06-11 DIAGNOSIS — M8580 Other specified disorders of bone density and structure, unspecified site: Secondary | ICD-10-CM | POA: Diagnosis not present

## 2021-06-27 DIAGNOSIS — R748 Abnormal levels of other serum enzymes: Secondary | ICD-10-CM | POA: Diagnosis not present

## 2021-06-27 DIAGNOSIS — K754 Autoimmune hepatitis: Secondary | ICD-10-CM | POA: Diagnosis not present

## 2021-06-27 DIAGNOSIS — K760 Fatty (change of) liver, not elsewhere classified: Secondary | ICD-10-CM | POA: Diagnosis not present

## 2021-06-29 DIAGNOSIS — J0100 Acute maxillary sinusitis, unspecified: Secondary | ICD-10-CM | POA: Diagnosis not present

## 2021-06-29 DIAGNOSIS — H6592 Unspecified nonsuppurative otitis media, left ear: Secondary | ICD-10-CM | POA: Diagnosis not present

## 2021-07-16 DIAGNOSIS — E7800 Pure hypercholesterolemia, unspecified: Secondary | ICD-10-CM | POA: Diagnosis not present

## 2021-07-16 DIAGNOSIS — M8580 Other specified disorders of bone density and structure, unspecified site: Secondary | ICD-10-CM | POA: Diagnosis not present

## 2021-07-16 DIAGNOSIS — E039 Hypothyroidism, unspecified: Secondary | ICD-10-CM | POA: Diagnosis not present

## 2021-07-16 DIAGNOSIS — E782 Mixed hyperlipidemia: Secondary | ICD-10-CM | POA: Diagnosis not present

## 2021-07-16 DIAGNOSIS — I10 Essential (primary) hypertension: Secondary | ICD-10-CM | POA: Diagnosis not present

## 2021-07-16 DIAGNOSIS — N183 Chronic kidney disease, stage 3 unspecified: Secondary | ICD-10-CM | POA: Diagnosis not present

## 2021-07-16 DIAGNOSIS — E1122 Type 2 diabetes mellitus with diabetic chronic kidney disease: Secondary | ICD-10-CM | POA: Diagnosis not present

## 2021-07-16 DIAGNOSIS — G4700 Insomnia, unspecified: Secondary | ICD-10-CM | POA: Diagnosis not present

## 2021-07-16 DIAGNOSIS — E099 Drug or chemical induced diabetes mellitus without complications: Secondary | ICD-10-CM | POA: Diagnosis not present

## 2021-07-17 DIAGNOSIS — D2372 Other benign neoplasm of skin of left lower limb, including hip: Secondary | ICD-10-CM | POA: Diagnosis not present

## 2021-07-17 DIAGNOSIS — D2261 Melanocytic nevi of right upper limb, including shoulder: Secondary | ICD-10-CM | POA: Diagnosis not present

## 2021-07-17 DIAGNOSIS — D225 Melanocytic nevi of trunk: Secondary | ICD-10-CM | POA: Diagnosis not present

## 2021-07-17 DIAGNOSIS — D1801 Hemangioma of skin and subcutaneous tissue: Secondary | ICD-10-CM | POA: Diagnosis not present

## 2021-07-17 DIAGNOSIS — D220 Melanocytic nevi of lip: Secondary | ICD-10-CM | POA: Diagnosis not present

## 2021-07-17 DIAGNOSIS — L821 Other seborrheic keratosis: Secondary | ICD-10-CM | POA: Diagnosis not present

## 2021-07-17 DIAGNOSIS — L814 Other melanin hyperpigmentation: Secondary | ICD-10-CM | POA: Diagnosis not present

## 2021-07-17 DIAGNOSIS — D2239 Melanocytic nevi of other parts of face: Secondary | ICD-10-CM | POA: Diagnosis not present

## 2021-07-17 DIAGNOSIS — D2271 Melanocytic nevi of right lower limb, including hip: Secondary | ICD-10-CM | POA: Diagnosis not present

## 2021-08-12 DIAGNOSIS — R748 Abnormal levels of other serum enzymes: Secondary | ICD-10-CM | POA: Diagnosis not present

## 2021-08-12 DIAGNOSIS — Z23 Encounter for immunization: Secondary | ICD-10-CM | POA: Diagnosis not present

## 2021-09-30 DIAGNOSIS — K754 Autoimmune hepatitis: Secondary | ICD-10-CM | POA: Diagnosis not present

## 2021-09-30 DIAGNOSIS — Z7984 Long term (current) use of oral hypoglycemic drugs: Secondary | ICD-10-CM | POA: Diagnosis not present

## 2021-09-30 DIAGNOSIS — E7800 Pure hypercholesterolemia, unspecified: Secondary | ICD-10-CM | POA: Diagnosis not present

## 2021-09-30 DIAGNOSIS — N183 Chronic kidney disease, stage 3 unspecified: Secondary | ICD-10-CM | POA: Diagnosis not present

## 2021-09-30 DIAGNOSIS — E1122 Type 2 diabetes mellitus with diabetic chronic kidney disease: Secondary | ICD-10-CM | POA: Diagnosis not present

## 2021-09-30 DIAGNOSIS — I10 Essential (primary) hypertension: Secondary | ICD-10-CM | POA: Diagnosis not present

## 2021-09-30 DIAGNOSIS — G4700 Insomnia, unspecified: Secondary | ICD-10-CM | POA: Diagnosis not present

## 2021-09-30 DIAGNOSIS — E039 Hypothyroidism, unspecified: Secondary | ICD-10-CM | POA: Diagnosis not present

## 2021-11-04 DIAGNOSIS — S92515A Nondisplaced fracture of proximal phalanx of left lesser toe(s), initial encounter for closed fracture: Secondary | ICD-10-CM | POA: Diagnosis not present

## 2021-11-06 ENCOUNTER — Other Ambulatory Visit: Payer: Self-pay | Admitting: Family Medicine

## 2021-11-06 DIAGNOSIS — Z1231 Encounter for screening mammogram for malignant neoplasm of breast: Secondary | ICD-10-CM

## 2021-12-03 DIAGNOSIS — R748 Abnormal levels of other serum enzymes: Secondary | ICD-10-CM | POA: Diagnosis not present

## 2021-12-03 DIAGNOSIS — E7800 Pure hypercholesterolemia, unspecified: Secondary | ICD-10-CM | POA: Diagnosis not present

## 2021-12-16 DIAGNOSIS — H0015 Chalazion left lower eyelid: Secondary | ICD-10-CM | POA: Diagnosis not present

## 2021-12-20 ENCOUNTER — Ambulatory Visit
Admission: RE | Admit: 2021-12-20 | Discharge: 2021-12-20 | Disposition: A | Discharge status: Home / Self Care | Payer: Medicare Other | Source: Ambulatory Visit

## 2021-12-20 DIAGNOSIS — Z1231 Encounter for screening mammogram for malignant neoplasm of breast: Secondary | ICD-10-CM | POA: Diagnosis not present

## 2022-02-12 DIAGNOSIS — M25551 Pain in right hip: Secondary | ICD-10-CM | POA: Diagnosis not present

## 2022-02-14 DIAGNOSIS — E1122 Type 2 diabetes mellitus with diabetic chronic kidney disease: Secondary | ICD-10-CM | POA: Diagnosis not present

## 2022-02-14 DIAGNOSIS — E039 Hypothyroidism, unspecified: Secondary | ICD-10-CM | POA: Diagnosis not present

## 2022-02-14 DIAGNOSIS — K219 Gastro-esophageal reflux disease without esophagitis: Secondary | ICD-10-CM | POA: Diagnosis not present

## 2022-02-14 DIAGNOSIS — E782 Mixed hyperlipidemia: Secondary | ICD-10-CM | POA: Diagnosis not present

## 2022-02-14 DIAGNOSIS — I10 Essential (primary) hypertension: Secondary | ICD-10-CM | POA: Diagnosis not present

## 2022-03-05 DIAGNOSIS — J069 Acute upper respiratory infection, unspecified: Secondary | ICD-10-CM | POA: Diagnosis not present

## 2022-03-12 DIAGNOSIS — E1122 Type 2 diabetes mellitus with diabetic chronic kidney disease: Secondary | ICD-10-CM | POA: Diagnosis not present

## 2022-03-12 DIAGNOSIS — E782 Mixed hyperlipidemia: Secondary | ICD-10-CM | POA: Diagnosis not present

## 2022-03-12 DIAGNOSIS — I10 Essential (primary) hypertension: Secondary | ICD-10-CM | POA: Diagnosis not present

## 2022-03-12 DIAGNOSIS — E039 Hypothyroidism, unspecified: Secondary | ICD-10-CM | POA: Diagnosis not present

## 2022-03-13 DIAGNOSIS — R058 Other specified cough: Secondary | ICD-10-CM | POA: Diagnosis not present

## 2022-03-18 DIAGNOSIS — J019 Acute sinusitis, unspecified: Secondary | ICD-10-CM | POA: Diagnosis not present

## 2022-03-18 DIAGNOSIS — R059 Cough, unspecified: Secondary | ICD-10-CM | POA: Diagnosis not present

## 2022-03-24 ENCOUNTER — Other Ambulatory Visit: Payer: Self-pay | Admitting: Physician Assistant

## 2022-03-24 ENCOUNTER — Ambulatory Visit
Admission: RE | Admit: 2022-03-24 | Discharge: 2022-03-24 | Disposition: A | Discharge status: Home / Self Care | Payer: Medicare Other | Source: Ambulatory Visit | Attending: Physician Assistant | Admitting: Physician Assistant

## 2022-03-24 DIAGNOSIS — R059 Cough, unspecified: Secondary | ICD-10-CM

## 2022-04-28 DIAGNOSIS — E1122 Type 2 diabetes mellitus with diabetic chronic kidney disease: Secondary | ICD-10-CM | POA: Diagnosis not present

## 2022-04-28 DIAGNOSIS — E039 Hypothyroidism, unspecified: Secondary | ICD-10-CM | POA: Diagnosis not present

## 2022-04-28 DIAGNOSIS — N183 Chronic kidney disease, stage 3 unspecified: Secondary | ICD-10-CM | POA: Diagnosis not present

## 2022-04-28 DIAGNOSIS — R809 Proteinuria, unspecified: Secondary | ICD-10-CM | POA: Diagnosis not present

## 2022-04-28 DIAGNOSIS — G4700 Insomnia, unspecified: Secondary | ICD-10-CM | POA: Diagnosis not present

## 2022-04-28 DIAGNOSIS — Z0000 Encounter for general adult medical examination without abnormal findings: Secondary | ICD-10-CM | POA: Diagnosis not present

## 2022-04-28 DIAGNOSIS — E7800 Pure hypercholesterolemia, unspecified: Secondary | ICD-10-CM | POA: Diagnosis not present

## 2022-04-28 DIAGNOSIS — K219 Gastro-esophageal reflux disease without esophagitis: Secondary | ICD-10-CM | POA: Diagnosis not present

## 2022-04-28 DIAGNOSIS — R748 Abnormal levels of other serum enzymes: Secondary | ICD-10-CM | POA: Diagnosis not present

## 2022-04-28 DIAGNOSIS — I10 Essential (primary) hypertension: Secondary | ICD-10-CM | POA: Diagnosis not present

## 2022-04-29 ENCOUNTER — Other Ambulatory Visit: Payer: Self-pay | Admitting: Family Medicine

## 2022-04-29 DIAGNOSIS — E2839 Other primary ovarian failure: Secondary | ICD-10-CM

## 2022-05-06 DIAGNOSIS — H5212 Myopia, left eye: Secondary | ICD-10-CM | POA: Diagnosis not present

## 2022-05-06 DIAGNOSIS — E119 Type 2 diabetes mellitus without complications: Secondary | ICD-10-CM | POA: Diagnosis not present

## 2022-05-06 DIAGNOSIS — Z961 Presence of intraocular lens: Secondary | ICD-10-CM | POA: Diagnosis not present

## 2022-05-06 DIAGNOSIS — D3141 Benign neoplasm of right ciliary body: Secondary | ICD-10-CM | POA: Diagnosis not present

## 2022-06-18 DIAGNOSIS — E039 Hypothyroidism, unspecified: Secondary | ICD-10-CM | POA: Diagnosis not present

## 2022-06-18 DIAGNOSIS — E1122 Type 2 diabetes mellitus with diabetic chronic kidney disease: Secondary | ICD-10-CM | POA: Diagnosis not present

## 2022-06-18 DIAGNOSIS — K219 Gastro-esophageal reflux disease without esophagitis: Secondary | ICD-10-CM | POA: Diagnosis not present

## 2022-06-18 DIAGNOSIS — E782 Mixed hyperlipidemia: Secondary | ICD-10-CM | POA: Diagnosis not present

## 2022-06-18 DIAGNOSIS — I10 Essential (primary) hypertension: Secondary | ICD-10-CM | POA: Diagnosis not present

## 2022-07-15 DIAGNOSIS — D485 Neoplasm of uncertain behavior of skin: Secondary | ICD-10-CM | POA: Diagnosis not present

## 2022-07-15 DIAGNOSIS — C4401 Basal cell carcinoma of skin of lip: Secondary | ICD-10-CM | POA: Diagnosis not present

## 2022-07-15 DIAGNOSIS — L821 Other seborrheic keratosis: Secondary | ICD-10-CM | POA: Diagnosis not present

## 2022-07-15 DIAGNOSIS — L570 Actinic keratosis: Secondary | ICD-10-CM | POA: Diagnosis not present

## 2022-07-15 DIAGNOSIS — D2271 Melanocytic nevi of right lower limb, including hip: Secondary | ICD-10-CM | POA: Diagnosis not present

## 2022-07-15 DIAGNOSIS — D2372 Other benign neoplasm of skin of left lower limb, including hip: Secondary | ICD-10-CM | POA: Diagnosis not present

## 2022-07-15 DIAGNOSIS — L738 Other specified follicular disorders: Secondary | ICD-10-CM | POA: Diagnosis not present

## 2022-07-15 DIAGNOSIS — D2261 Melanocytic nevi of right upper limb, including shoulder: Secondary | ICD-10-CM | POA: Diagnosis not present

## 2022-07-15 DIAGNOSIS — D2239 Melanocytic nevi of other parts of face: Secondary | ICD-10-CM | POA: Diagnosis not present

## 2022-07-15 DIAGNOSIS — L814 Other melanin hyperpigmentation: Secondary | ICD-10-CM | POA: Diagnosis not present

## 2022-07-15 DIAGNOSIS — D220 Melanocytic nevi of lip: Secondary | ICD-10-CM | POA: Diagnosis not present

## 2022-07-15 DIAGNOSIS — D225 Melanocytic nevi of trunk: Secondary | ICD-10-CM | POA: Diagnosis not present

## 2022-08-05 DIAGNOSIS — Z23 Encounter for immunization: Secondary | ICD-10-CM | POA: Diagnosis not present

## 2022-08-05 DIAGNOSIS — E039 Hypothyroidism, unspecified: Secondary | ICD-10-CM | POA: Diagnosis not present

## 2022-08-05 DIAGNOSIS — E1169 Type 2 diabetes mellitus with other specified complication: Secondary | ICD-10-CM | POA: Diagnosis not present

## 2022-08-27 DIAGNOSIS — C4401 Basal cell carcinoma of skin of lip: Secondary | ICD-10-CM | POA: Diagnosis not present

## 2022-10-10 ENCOUNTER — Ambulatory Visit
Admission: RE | Admit: 2022-10-10 | Discharge: 2022-10-10 | Disposition: A | Discharge status: Home / Self Care | Payer: Medicare Other | Source: Ambulatory Visit | Attending: Family Medicine | Admitting: Family Medicine

## 2022-10-10 DIAGNOSIS — Z780 Asymptomatic menopausal state: Secondary | ICD-10-CM | POA: Diagnosis not present

## 2022-10-10 DIAGNOSIS — M8589 Other specified disorders of bone density and structure, multiple sites: Secondary | ICD-10-CM | POA: Diagnosis not present

## 2022-10-10 DIAGNOSIS — E2839 Other primary ovarian failure: Secondary | ICD-10-CM

## 2022-10-22 ENCOUNTER — Other Ambulatory Visit: Payer: Medicare Other

## 2022-10-28 DIAGNOSIS — U071 COVID-19: Secondary | ICD-10-CM | POA: Diagnosis not present

## 2022-10-30 DIAGNOSIS — N183 Chronic kidney disease, stage 3 unspecified: Secondary | ICD-10-CM | POA: Diagnosis not present

## 2022-10-30 DIAGNOSIS — E1122 Type 2 diabetes mellitus with diabetic chronic kidney disease: Secondary | ICD-10-CM | POA: Diagnosis not present

## 2022-10-30 DIAGNOSIS — E7800 Pure hypercholesterolemia, unspecified: Secondary | ICD-10-CM | POA: Diagnosis not present

## 2022-10-30 DIAGNOSIS — K219 Gastro-esophageal reflux disease without esophagitis: Secondary | ICD-10-CM | POA: Diagnosis not present

## 2022-10-30 DIAGNOSIS — I10 Essential (primary) hypertension: Secondary | ICD-10-CM | POA: Diagnosis not present

## 2022-10-30 DIAGNOSIS — E039 Hypothyroidism, unspecified: Secondary | ICD-10-CM | POA: Diagnosis not present

## 2022-10-30 DIAGNOSIS — G4700 Insomnia, unspecified: Secondary | ICD-10-CM | POA: Diagnosis not present

## 2023-01-20 ENCOUNTER — Other Ambulatory Visit: Payer: Self-pay | Admitting: Family Medicine

## 2023-01-20 DIAGNOSIS — Z0000 Encounter for general adult medical examination without abnormal findings: Secondary | ICD-10-CM

## 2023-01-27 ENCOUNTER — Ambulatory Visit
Admission: RE | Admit: 2023-01-27 | Discharge: 2023-01-27 | Disposition: A | Discharge status: Home / Self Care | Payer: Medicare Other | Source: Ambulatory Visit | Attending: Family Medicine | Admitting: Family Medicine

## 2023-01-27 DIAGNOSIS — Z0000 Encounter for general adult medical examination without abnormal findings: Secondary | ICD-10-CM

## 2023-01-27 DIAGNOSIS — Z1231 Encounter for screening mammogram for malignant neoplasm of breast: Secondary | ICD-10-CM | POA: Diagnosis not present

## 2023-03-17 DIAGNOSIS — M79642 Pain in left hand: Secondary | ICD-10-CM | POA: Diagnosis not present

## 2023-03-27 DIAGNOSIS — H00012 Hordeolum externum right lower eyelid: Secondary | ICD-10-CM | POA: Diagnosis not present

## 2023-04-13 IMAGING — MG MM DIGITAL SCREENING BILAT W/ TOMO AND CAD
6 of 12 series · 6 of 36 positions shown · non-contrast
Comparison: Previous exam(s).

CLINICAL DATA: Screening.

EXAM:
DIGITAL SCREENING BILATERAL MAMMOGRAM WITH TOMOSYNTHESIS AND CAD
TECHNIQUE: Bilateral screening digital craniocaudal and mediolateral oblique
mammograms were obtained. Bilateral screening digital breast
tomosynthesis was performed. The images were evaluated with
computer-aided detection.

[L CC synth-2D (1 of 2)]
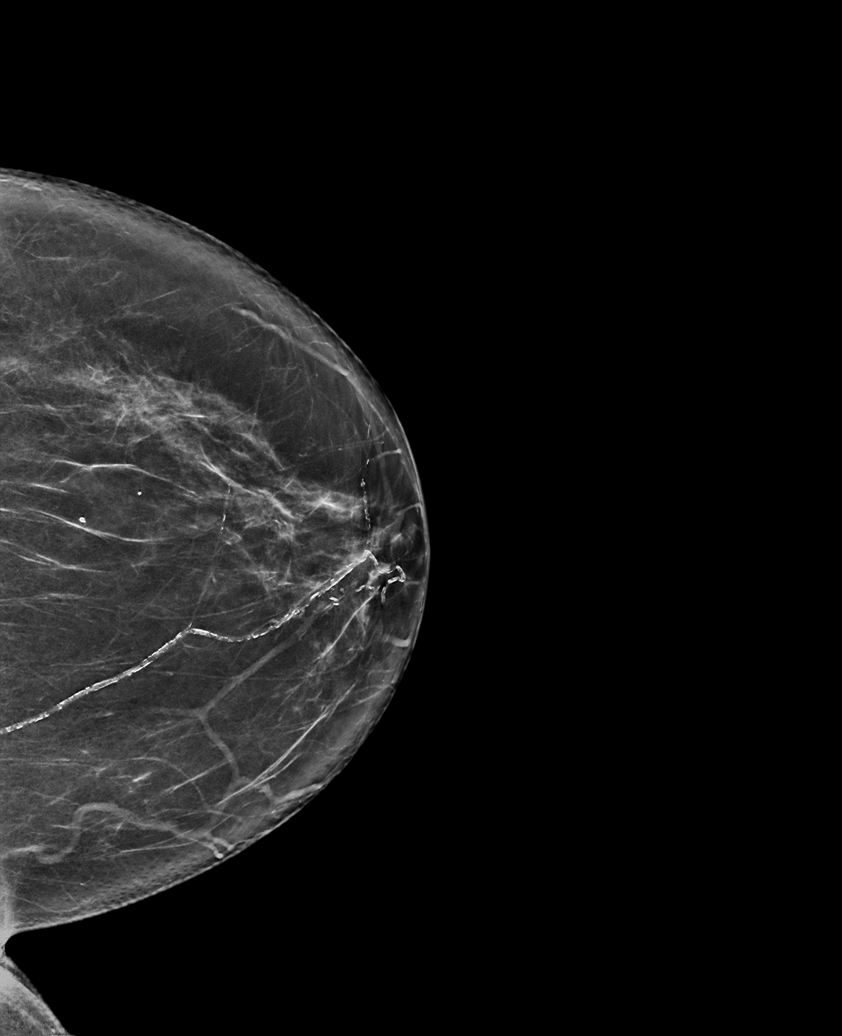

[L MLO synth-2D]
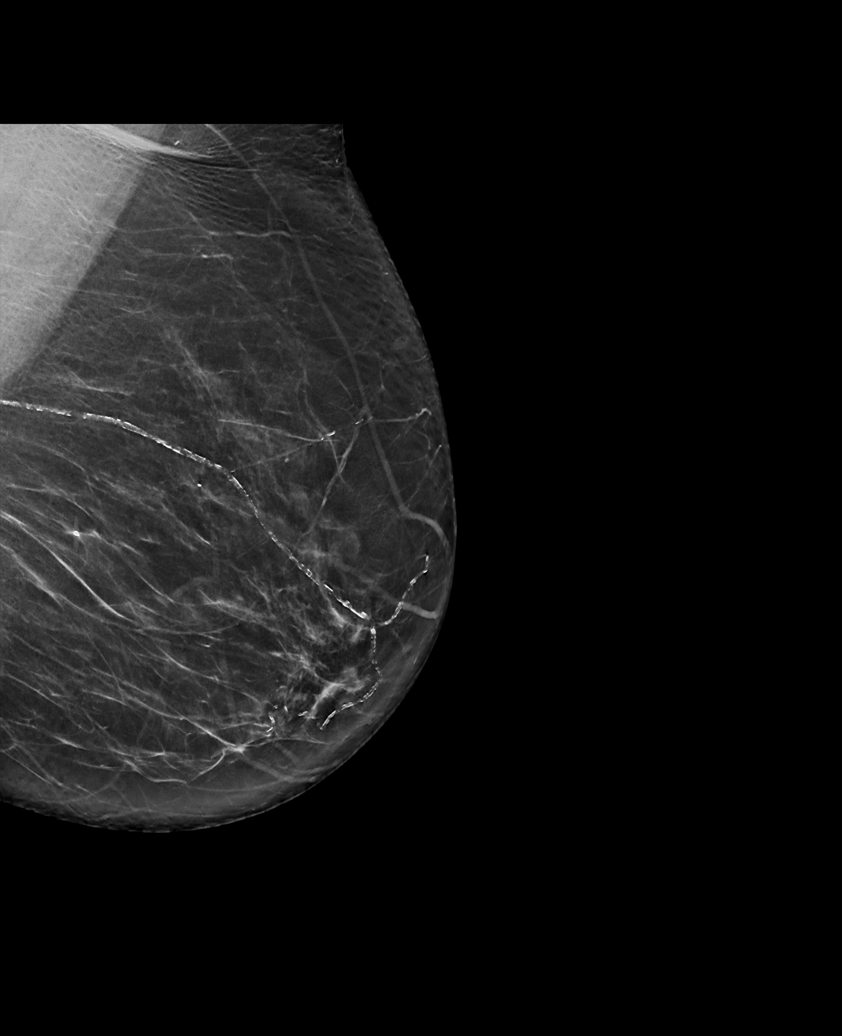

[L CC synth-2D (2 of 2)]
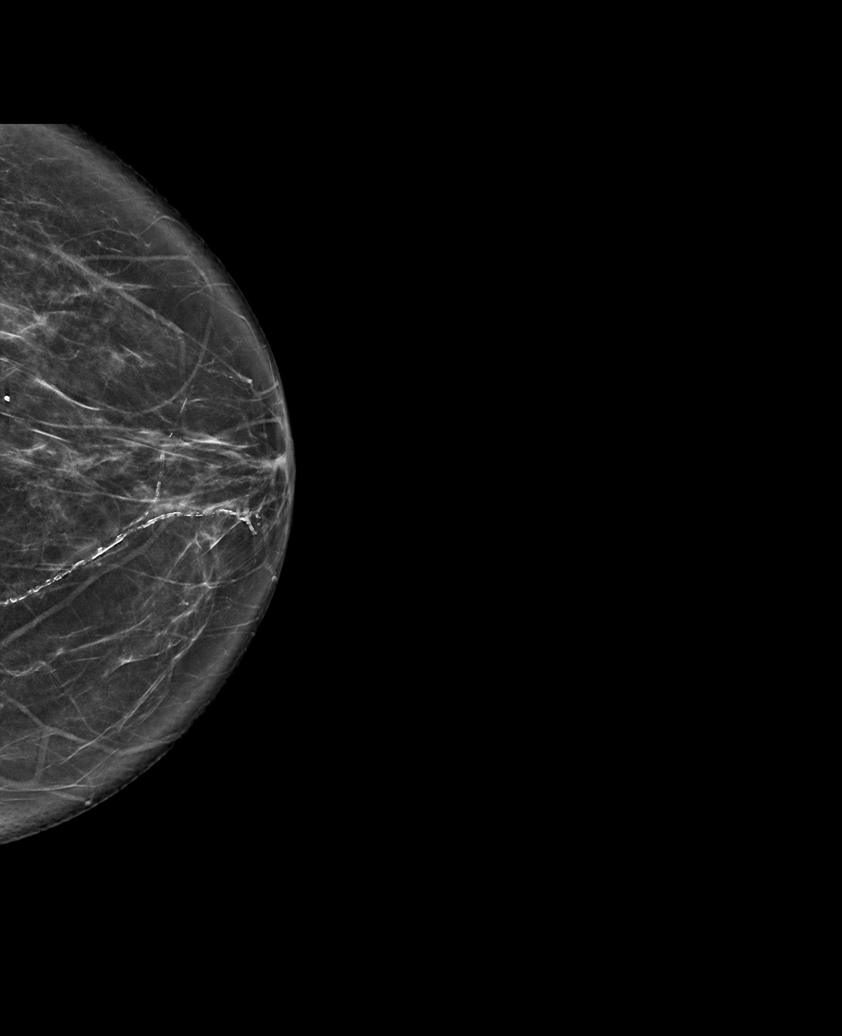

[R MLO synth-2D]
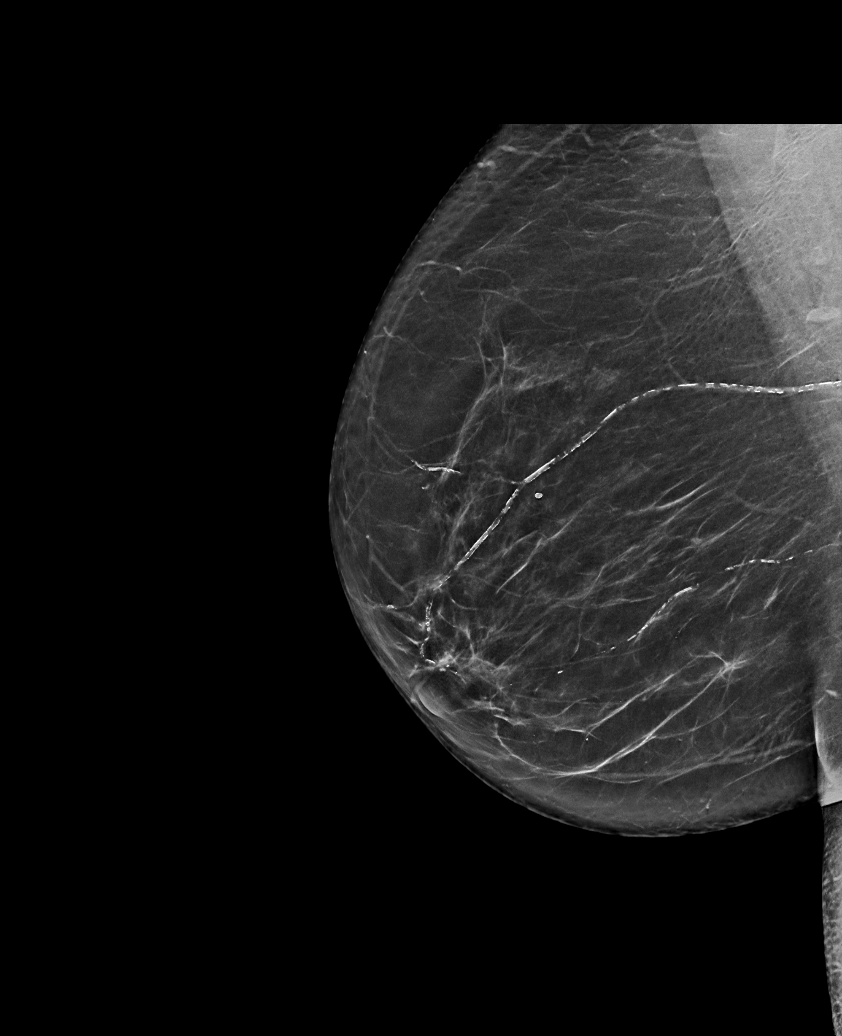

[R CC synth-2D (1 of 2)]
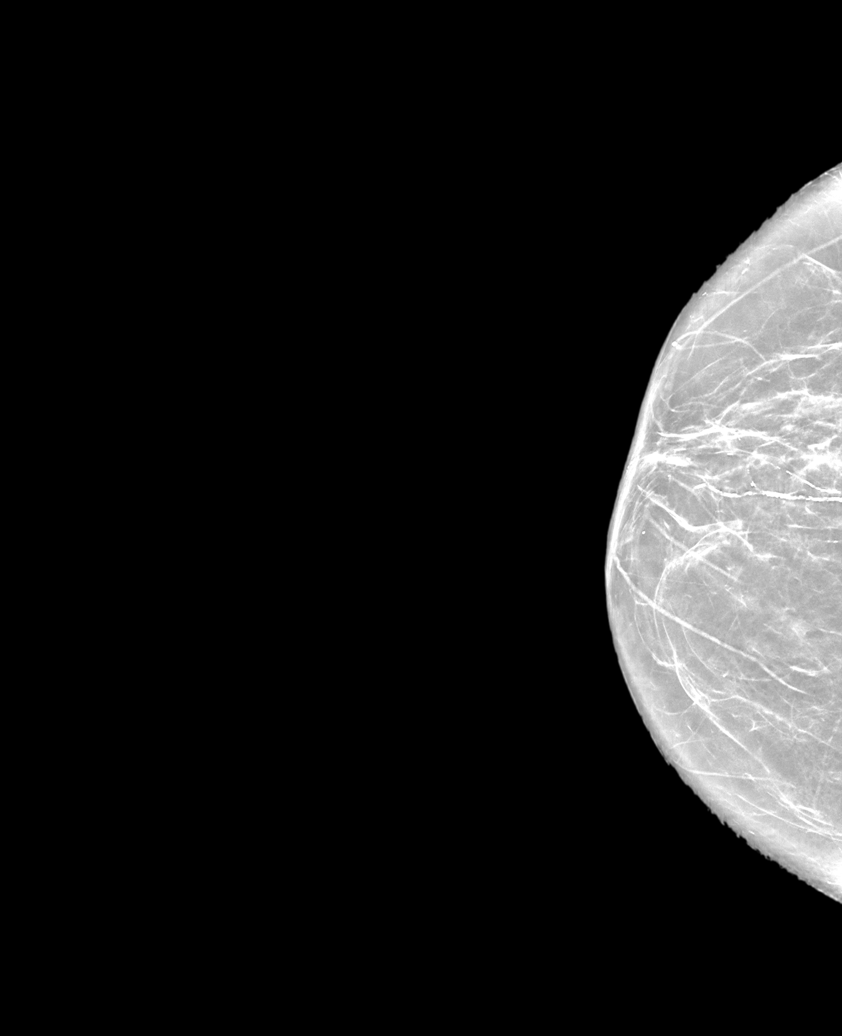

[R CC synth-2D (2 of 2)]
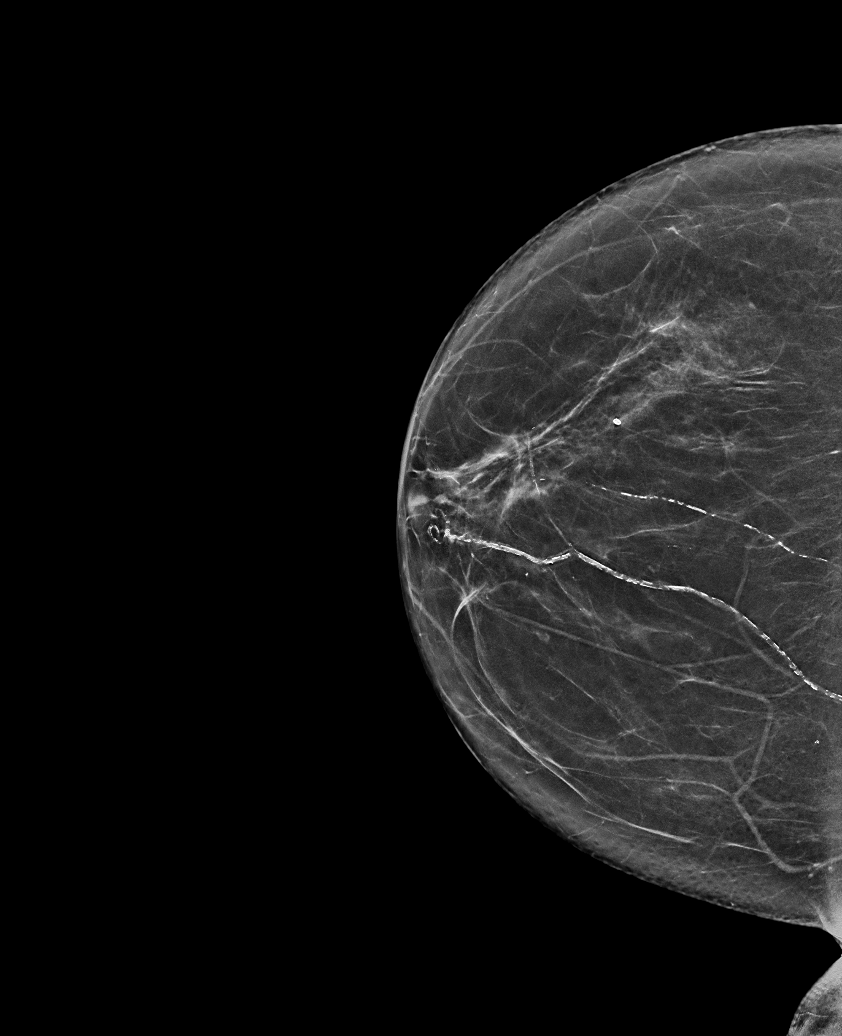

[6 of 36 positions shown; findings below may reference images not displayed]

ACR Breast Density Category b: There are scattered areas of
fibroglandular density.
FINDINGS: There are no findings suspicious for malignancy. The images were
evaluated with computer-aided detection.
IMPRESSION: No mammographic evidence of malignancy. A result letter of this
screening mammogram will be mailed directly to the patient.

RECOMMENDATION:
Screening mammogram in one year. (Code:WJ-I-BG6)

BI-RADS CATEGORY  1: Negative.

## 2023-05-13 DIAGNOSIS — E039 Hypothyroidism, unspecified: Secondary | ICD-10-CM | POA: Diagnosis not present

## 2023-05-13 DIAGNOSIS — E1169 Type 2 diabetes mellitus with other specified complication: Secondary | ICD-10-CM | POA: Diagnosis not present

## 2023-05-13 DIAGNOSIS — Z23 Encounter for immunization: Secondary | ICD-10-CM | POA: Diagnosis not present

## 2023-05-13 DIAGNOSIS — I10 Essential (primary) hypertension: Secondary | ICD-10-CM | POA: Diagnosis not present

## 2023-05-13 DIAGNOSIS — D869 Sarcoidosis, unspecified: Secondary | ICD-10-CM | POA: Diagnosis not present

## 2023-05-13 DIAGNOSIS — E559 Vitamin D deficiency, unspecified: Secondary | ICD-10-CM | POA: Diagnosis not present

## 2023-05-13 DIAGNOSIS — E119 Type 2 diabetes mellitus without complications: Secondary | ICD-10-CM | POA: Diagnosis not present

## 2023-05-13 DIAGNOSIS — E7800 Pure hypercholesterolemia, unspecified: Secondary | ICD-10-CM | POA: Diagnosis not present

## 2023-05-13 DIAGNOSIS — Z0000 Encounter for general adult medical examination without abnormal findings: Secondary | ICD-10-CM | POA: Diagnosis not present

## 2023-05-18 DIAGNOSIS — Z85828 Personal history of other malignant neoplasm of skin: Secondary | ICD-10-CM | POA: Diagnosis not present

## 2023-05-18 DIAGNOSIS — C44319 Basal cell carcinoma of skin of other parts of face: Secondary | ICD-10-CM | POA: Diagnosis not present

## 2023-05-18 DIAGNOSIS — L720 Epidermal cyst: Secondary | ICD-10-CM | POA: Diagnosis not present

## 2023-05-18 DIAGNOSIS — D485 Neoplasm of uncertain behavior of skin: Secondary | ICD-10-CM | POA: Diagnosis not present

## 2023-05-19 DIAGNOSIS — Z961 Presence of intraocular lens: Secondary | ICD-10-CM | POA: Diagnosis not present

## 2023-05-19 DIAGNOSIS — H5212 Myopia, left eye: Secondary | ICD-10-CM | POA: Diagnosis not present

## 2023-05-19 DIAGNOSIS — E119 Type 2 diabetes mellitus without complications: Secondary | ICD-10-CM | POA: Diagnosis not present

## 2023-05-25 ENCOUNTER — Encounter: Payer: Self-pay | Admitting: Podiatry

## 2023-05-25 ENCOUNTER — Ambulatory Visit: Payer: Medicare Other | Admitting: Podiatry

## 2023-05-25 DIAGNOSIS — M2042 Other hammer toe(s) (acquired), left foot: Secondary | ICD-10-CM

## 2023-05-25 DIAGNOSIS — M2041 Other hammer toe(s) (acquired), right foot: Secondary | ICD-10-CM | POA: Diagnosis not present

## 2023-05-25 DIAGNOSIS — L97511 Non-pressure chronic ulcer of other part of right foot limited to breakdown of skin: Secondary | ICD-10-CM | POA: Diagnosis not present

## 2023-05-25 DIAGNOSIS — M6740 Ganglion, unspecified site: Secondary | ICD-10-CM

## 2023-05-25 NOTE — Progress Notes (Signed)
Subjective:  Patient ID: Yolanda Oconnell, female    DOB: 1950-11-06,   MRN: [id number]  No chief complaint on file.   72 y.o. female presents for concern of right third toe pain and left fourth toe pain. Relates this has been going on a while. She relates a spot on the inside of the third right toe that has been rubbing. States she broke the left fourth toe about a year ago. States it has recently started to ache. Patient is diabetic and last A1c was 6.8 but sugars have been variable recently. Does relates some mild tingling and sensation in the foot   PCP:  [name], MD    . Denies any other pedal complaints. Denies n/v/f/c.   Past Medical History:  Diagnosis Date   Arthritis    arthritis-knees, shoulders   Environmental allergies    GERD (gastroesophageal reflux disease)    Hyperlipidemia    Hypertension    Hypothyroid    Neurological symptoms    R-sided pain/numbness/tingling/cold sensation in foot; arm and leg pain periodically - she is seeing a neurologist for this   PONV (postoperative nausea and vomiting)    Sarcoidosis 2004   tested positive-no active disease    Objective:  Physical Exam: Vascular: DP/PT pulses 2/4 bilateral. CFT <3 seconds. Normal hair growth on digits. No edema.  Skin. No lacerations or abrasions bilateral feet. Hyperkeratotic fluid filled lesion noted to medial dorsal right third DIPJ. Upon debridement ulceration noted superficial with joint fluid gel like fluid removed consistent with ganglion and open ulceration. Ulceration measures about 0.2 cm x 0.3 cm x 0.1 cm with granular base after washout.  Musculoskeletal: MMT 5/5 bilateral lower extremities in DF, PF, Inversion and Eversion. Deceased ROM in DF of ankle joint.  Neurological: Sensation intact to light touch.   Assessment:   1. Skin ulcer of third toe of right foot, limited to breakdown of skin (HCC)   2. Hammertoe, bilateral   3. Ganglion cyst      Plan:  Patient was evaluated and  treated and all questions answered. Ulcer right third digit limtied to breakdown of skin.  -Debridement as below. -Dressed with neosporin. , DSD. -Toe caps provided.  -No abx indicated.  -Discussed glucose control and proper protein-rich diet.  -Discussed if any worsening redness, pain, fever or chills to call or may need to report to the emergency room. Patient expressed understanding.   Procedure: Excisional Debridement of Wound Rationale: Removal of non-viable soft tissue from the wound to promote healing.  Anesthesia: none Pre-Debridement Wound Measurements: Overlying hyperkeratosis and ganglion cyst like fluid  Post-Debridement Wound Measurements: 0.2 cm x 0.3 cm x 0.1 cm  Type of Debridement: Sharp Excisional Tissue Removed: Non-viable soft tissue Depth of Debridement: subcutaneous tissue. Technique: Sharp excisional debridement to bleeding, viable wound base.  Dressing: Dry, sterile, compression dressing. Disposition: Patient tolerated procedure well. Patient to return in 2 week for follow-up.  Return in about 2 weeks (around 06/08/2023) for wound check.   Louann Sjogren, DPM

## 2023-06-08 ENCOUNTER — Ambulatory Visit: Payer: Medicare Other | Admitting: Podiatry

## 2023-06-08 DIAGNOSIS — M2041 Other hammer toe(s) (acquired), right foot: Secondary | ICD-10-CM

## 2023-06-08 DIAGNOSIS — M2042 Other hammer toe(s) (acquired), left foot: Secondary | ICD-10-CM | POA: Diagnosis not present

## 2023-06-08 DIAGNOSIS — L97511 Non-pressure chronic ulcer of other part of right foot limited to breakdown of skin: Secondary | ICD-10-CM

## 2023-06-08 DIAGNOSIS — M6740 Ganglion, unspecified site: Secondary | ICD-10-CM | POA: Diagnosis not present

## 2023-06-08 NOTE — Progress Notes (Signed)
Subjective:  Patient ID: Yolanda Oconnell, female    DOB: [birth year],   MRN: [id number]  No chief complaint on file.   72 y.o. female presents for follow-up of left third toe ulceration and ganglion. Relates doing better but still present and sore at times.  Patient is diabetic and last A1c was 6.8 but sugars have been variable recently. Does relates some mild tingling and sensation in the foot   PCP:  [name], MD    . Denies any other pedal complaints. Denies n/v/f/c.   Past Medical History:  Diagnosis Date   Arthritis    arthritis-knees, shoulders   Environmental allergies    GERD (gastroesophageal reflux disease)    Hyperlipidemia    Hypertension    Hypothyroid    Neurological symptoms    R-sided pain/numbness/tingling/cold sensation in foot; arm and leg pain periodically - she is seeing a neurologist for this   PONV (postoperative nausea and vomiting)    Sarcoidosis 2004   tested positive-no active disease    Objective:  Physical Exam: Vascular: DP/PT pulses 2/4 bilateral. CFT <3 seconds. Normal hair growth on digits. No edema.  Skin. No lacerations or abrasions bilateral feet. Hyperkeratotic fluid filled lesion noted to medial dorsal right third DIPJ. Upon debridement ulceration noted superficial with joint fluid gel like fluid removed consistent with ganglion and open ulceration. Ulceration measures about 0.2 cm x 0.2 cm x 0.1 cm with granular base after washout.  Musculoskeletal: MMT 5/5 bilateral lower extremities in DF, PF, Inversion and Eversion. Deceased ROM in DF of ankle joint.  Neurological: Sensation intact to light touch.   Assessment:   1. Skin ulcer of third toe of right foot, limited to breakdown of skin (HCC)   2. Hammertoe, bilateral       Plan:  Patient was evaluated and treated and all questions answered. Ulcer right third digit limtied to breakdown of skin.  -Debridement as below. -Dressed with neosporin. , DSD. -Compression dressing applied   -Discussed if we continued to have trouble would consider arthroplasty and removal of mucoid cyst.   -No abx indicated.  -Discussed glucose control and proper protein-rich diet.  -Discussed if any worsening redness, pain, fever or chills to call or may need to report to the emergency room. Patient expressed understanding.   Procedure: Excisional Debridement of Wound Rationale: Removal of non-viable soft tissue from the wound to promote healing.  Anesthesia: none Pre-Debridement Wound Measurements: Overlying hyperkeratosis and ganglion cyst like fluid  Post-Debridement Wound Measurements: 0.2 cm x 0.2 cm x 0.1 cm  Type of Debridement: Sharp Excisional Tissue Removed: Non-viable soft tissue Depth of Debridement: subcutaneous tissue.  Technique: Sharp excisional debridement to bleeding, viable wound base.  Dressing: Dry, sterile, compression dressing. Disposition: Patient tolerated procedure well. Patient to return in 2 week for follow-up.  No follow-ups on file.   Louann Sjogren, DPM

## 2023-06-15 DIAGNOSIS — C44329 Squamous cell carcinoma of skin of other parts of face: Secondary | ICD-10-CM | POA: Diagnosis not present

## 2023-06-22 DIAGNOSIS — L2989 Other pruritus: Secondary | ICD-10-CM | POA: Diagnosis not present

## 2023-06-22 DIAGNOSIS — H9203 Otalgia, bilateral: Secondary | ICD-10-CM | POA: Diagnosis not present

## 2023-06-23 ENCOUNTER — Ambulatory Visit: Payer: Medicare Other | Admitting: Podiatry

## 2023-06-23 ENCOUNTER — Encounter: Payer: Self-pay | Admitting: Podiatry

## 2023-06-23 DIAGNOSIS — M6740 Ganglion, unspecified site: Secondary | ICD-10-CM | POA: Diagnosis not present

## 2023-06-23 DIAGNOSIS — E11621 Type 2 diabetes mellitus with foot ulcer: Secondary | ICD-10-CM

## 2023-06-23 DIAGNOSIS — L97509 Non-pressure chronic ulcer of other part of unspecified foot with unspecified severity: Secondary | ICD-10-CM | POA: Diagnosis not present

## 2023-06-23 DIAGNOSIS — M2041 Other hammer toe(s) (acquired), right foot: Secondary | ICD-10-CM | POA: Diagnosis not present

## 2023-06-23 DIAGNOSIS — M2042 Other hammer toe(s) (acquired), left foot: Secondary | ICD-10-CM

## 2023-06-23 NOTE — Progress Notes (Signed)
Subjective:  Patient ID: Yolanda Oconnell, female    DOB: Apr 03, 1951,   MRN: [id number]  Chief Complaint  Patient presents with   Toe Pain    Pt presents for follow-up of left third toe ulceration pt states she doing better but still still sore from time to time.    72 y.o. female presents for follow-up of left third toe ulceration and ganglion. Relates doing better but still present and sore at times. Relates she believes it has healed.   Patient is diabetic and last A1c was 6.6 but sugars have been variable recently. Does relates some mild tingling and sensation in the foot   PCP:  Yolanda Brunette, MD    . Denies any other pedal complaints. Denies n/v/f/c.   Past Medical History:  Diagnosis Date   Arthritis    arthritis-knees, shoulders   Environmental allergies    GERD (gastroesophageal reflux disease)    Hyperlipidemia    Hypertension    Hypothyroid    Neurological symptoms    R-sided pain/numbness/tingling/cold sensation in foot; arm and leg pain periodically - she is seeing a neurologist for this   PONV (postoperative nausea and vomiting)    Sarcoidosis 2004   tested positive-no active disease    Objective:  Physical Exam: Vascular: DP/PT pulses 2/4 bilateral. CFT <3 seconds. Normal hair growth on digits. No edema.  Skin. No lacerations or abrasions bilateral feet. Hyperkeratotic fluid filled lesion noted to medial dorsal right third DIPJ. No open ulceration noted today.  Musculoskeletal: MMT 5/5 bilateral lower extremities in DF, PF, Inversion and Eversion. Deceased ROM in DF of ankle joint.  Neurological: Sensation intact to light touch.   Assessment:   1. Hammertoe, bilateral   2. Ganglion cyst   3. Type 2 diabetes mellitus with foot ulcer, without long-term current use of insulin (HCC)        Plan:  Patient was evaluated and treated and all questions answered. Ulcer right third digit -healed  -Discussed giving the toe more time to heal and see if we still have  soreness in a few weeks.  -Discussed if we continued to have trouble would consider arthroplasty and removal of mucoid cyst.   -No abx indicated.  -Discussed glucose control and proper protein-rich diet.  -Discussed if any worsening redness, pain, fever or chills to call or may need to report to the emergency room. Patient expressed understanding.   Return in 4 weeks for recheck.   Return in about 4 weeks (around 07/21/2023) for wound check.   Louann Sjogren, DPM

## 2023-07-21 ENCOUNTER — Encounter: Payer: Self-pay | Admitting: Podiatry

## 2023-07-21 ENCOUNTER — Ambulatory Visit: Payer: Medicare Other | Admitting: Podiatry

## 2023-07-21 DIAGNOSIS — E11621 Type 2 diabetes mellitus with foot ulcer: Secondary | ICD-10-CM | POA: Diagnosis not present

## 2023-07-21 DIAGNOSIS — M2042 Other hammer toe(s) (acquired), left foot: Secondary | ICD-10-CM

## 2023-07-21 DIAGNOSIS — M2041 Other hammer toe(s) (acquired), right foot: Secondary | ICD-10-CM

## 2023-07-21 DIAGNOSIS — L97509 Non-pressure chronic ulcer of other part of unspecified foot with unspecified severity: Secondary | ICD-10-CM | POA: Diagnosis not present

## 2023-07-21 DIAGNOSIS — M6740 Ganglion, unspecified site: Secondary | ICD-10-CM | POA: Diagnosis not present

## 2023-07-21 NOTE — Progress Notes (Signed)
  Subjective:  Patient ID: Yolanda Oconnell, female    DOB: 01-10-1951,   MRN: [id number]  Chief Complaint  Patient presents with   Hammer Toe    Pt presents for follow-up of left third toe ulceration pt states she doing better but still still sore from time to time.       72 y.o. female presents for follow-up of left third toe ulceration and ganglion. Relates doing better it has remained healed. Still bothers her occaisonally but relates she has a lot going on at home and would not be a good time for surgery.  Patient is diabetic and last A1c was 6.6 but sugars have been variable recently. Does relates some mild tingling and sensation in the foot   PCP:  [name], MD    . Denies any other pedal complaints. Denies n/v/f/c.   Past Medical History:  Diagnosis Date   Arthritis    arthritis-knees, shoulders   Environmental allergies    GERD (gastroesophageal reflux disease)    Hyperlipidemia    Hypertension    Hypothyroid    Neurological symptoms    R-sided pain/numbness/tingling/cold sensation in foot; arm and leg pain periodically - she is seeing a neurologist for this   PONV (postoperative nausea and vomiting)    Sarcoidosis 2004   tested positive-no active disease    Objective:  Physical Exam: Vascular: DP/PT pulses 2/4 bilateral. CFT <3 seconds. Normal hair growth on digits. No edema.  Skin. No lacerations or abrasions bilateral feet. Hyperkeratotic fluid filled lesion noted to medial dorsal right third DIPJ. No open ulceration noted today.  Musculoskeletal: MMT 5/5 bilateral lower extremities in DF, PF, Inversion and Eversion. Deceased ROM in DF of ankle joint.  Neurological: Sensation intact to light touch.   Assessment:   1. Ganglion cyst   2. Hammertoe, bilateral   3. Type 2 diabetes mellitus with foot ulcer, without long-term current use of insulin (HCC)         Plan:  Patient was evaluated and treated and all questions answered. Ulcer right third digit  -healed  -Discussed doing well currently.   -Discussed if we continued to have trouble would consider arthroplasty and removal of mucoid cyst.   -Will have to wait for this until her daughter and husband heal.  -No abx indicated.  -Discussed glucose control and proper protein-rich diet.  -Discussed if any worsening redness, pain, fever or chills to call or may need to report to the emergency room. Patient expressed understanding.   Return as needed   Return if symptoms worsen or fail to improve.   Louann Sjogren, DPM

## 2023-07-27 DIAGNOSIS — H43811 Vitreous degeneration, right eye: Secondary | ICD-10-CM | POA: Diagnosis not present

## 2023-07-27 DIAGNOSIS — H31001 Unspecified chorioretinal scars, right eye: Secondary | ICD-10-CM | POA: Diagnosis not present

## 2023-07-27 DIAGNOSIS — H02834 Dermatochalasis of left upper eyelid: Secondary | ICD-10-CM | POA: Diagnosis not present

## 2023-07-27 DIAGNOSIS — H02831 Dermatochalasis of right upper eyelid: Secondary | ICD-10-CM | POA: Diagnosis not present

## 2023-08-18 DIAGNOSIS — H02834 Dermatochalasis of left upper eyelid: Secondary | ICD-10-CM | POA: Diagnosis not present

## 2023-08-18 DIAGNOSIS — H02831 Dermatochalasis of right upper eyelid: Secondary | ICD-10-CM | POA: Diagnosis not present

## 2023-08-18 DIAGNOSIS — H31001 Unspecified chorioretinal scars, right eye: Secondary | ICD-10-CM | POA: Diagnosis not present

## 2023-08-19 DIAGNOSIS — E039 Hypothyroidism, unspecified: Secondary | ICD-10-CM | POA: Diagnosis not present

## 2023-08-28 DIAGNOSIS — D220 Melanocytic nevi of lip: Secondary | ICD-10-CM | POA: Diagnosis not present

## 2023-08-28 DIAGNOSIS — Z85828 Personal history of other malignant neoplasm of skin: Secondary | ICD-10-CM | POA: Diagnosis not present

## 2023-08-28 DIAGNOSIS — B078 Other viral warts: Secondary | ICD-10-CM | POA: Diagnosis not present

## 2023-08-28 DIAGNOSIS — L821 Other seborrheic keratosis: Secondary | ICD-10-CM | POA: Diagnosis not present

## 2023-08-28 DIAGNOSIS — L814 Other melanin hyperpigmentation: Secondary | ICD-10-CM | POA: Diagnosis not present

## 2023-08-28 DIAGNOSIS — D225 Melanocytic nevi of trunk: Secondary | ICD-10-CM | POA: Diagnosis not present

## 2023-08-28 DIAGNOSIS — D2261 Melanocytic nevi of right upper limb, including shoulder: Secondary | ICD-10-CM | POA: Diagnosis not present

## 2023-08-28 DIAGNOSIS — D1801 Hemangioma of skin and subcutaneous tissue: Secondary | ICD-10-CM | POA: Diagnosis not present

## 2023-08-28 DIAGNOSIS — L918 Other hypertrophic disorders of the skin: Secondary | ICD-10-CM | POA: Diagnosis not present

## 2023-08-28 DIAGNOSIS — D485 Neoplasm of uncertain behavior of skin: Secondary | ICD-10-CM | POA: Diagnosis not present

## 2023-08-28 DIAGNOSIS — L570 Actinic keratosis: Secondary | ICD-10-CM | POA: Diagnosis not present

## 2023-08-28 DIAGNOSIS — L738 Other specified follicular disorders: Secondary | ICD-10-CM | POA: Diagnosis not present

## 2023-08-28 DIAGNOSIS — D2372 Other benign neoplasm of skin of left lower limb, including hip: Secondary | ICD-10-CM | POA: Diagnosis not present

## 2023-10-20 DIAGNOSIS — E7800 Pure hypercholesterolemia, unspecified: Secondary | ICD-10-CM | POA: Diagnosis not present

## 2023-10-20 DIAGNOSIS — I10 Essential (primary) hypertension: Secondary | ICD-10-CM | POA: Diagnosis not present

## 2023-10-20 DIAGNOSIS — E039 Hypothyroidism, unspecified: Secondary | ICD-10-CM | POA: Diagnosis not present

## 2023-10-20 DIAGNOSIS — E099 Drug or chemical induced diabetes mellitus without complications: Secondary | ICD-10-CM | POA: Diagnosis not present

## 2023-10-20 DIAGNOSIS — E1122 Type 2 diabetes mellitus with diabetic chronic kidney disease: Secondary | ICD-10-CM | POA: Diagnosis not present

## 2023-12-17 DIAGNOSIS — K080 Exfoliation of teeth due to systemic causes: Secondary | ICD-10-CM | POA: Diagnosis not present

## 2024-01-19 DIAGNOSIS — L989 Disorder of the skin and subcutaneous tissue, unspecified: Secondary | ICD-10-CM | POA: Diagnosis not present

## 2024-01-25 DIAGNOSIS — Z85828 Personal history of other malignant neoplasm of skin: Secondary | ICD-10-CM | POA: Diagnosis not present

## 2024-01-25 DIAGNOSIS — L858 Other specified epidermal thickening: Secondary | ICD-10-CM | POA: Diagnosis not present

## 2024-01-25 DIAGNOSIS — M71371 Other bursal cyst, right ankle and foot: Secondary | ICD-10-CM | POA: Diagnosis not present

## 2024-03-22 ENCOUNTER — Other Ambulatory Visit: Payer: Self-pay | Admitting: Family Medicine

## 2024-03-22 ENCOUNTER — Ambulatory Visit
Admission: RE | Admit: 2024-03-22 | Discharge: 2024-03-22 | Disposition: A | Discharge status: Home / Self Care | Source: Ambulatory Visit | Attending: Family Medicine | Admitting: Family Medicine

## 2024-03-22 DIAGNOSIS — Z1231 Encounter for screening mammogram for malignant neoplasm of breast: Secondary | ICD-10-CM | POA: Diagnosis not present

## 2024-03-22 DIAGNOSIS — Z0000 Encounter for general adult medical examination without abnormal findings: Secondary | ICD-10-CM

## 2024-04-05 DIAGNOSIS — M71371 Other bursal cyst, right ankle and foot: Secondary | ICD-10-CM | POA: Diagnosis not present

## 2024-04-26 DIAGNOSIS — M6740 Ganglion, unspecified site: Secondary | ICD-10-CM | POA: Diagnosis not present

## 2024-04-26 DIAGNOSIS — M79674 Pain in right toe(s): Secondary | ICD-10-CM | POA: Diagnosis not present

## 2024-05-24 DIAGNOSIS — H5212 Myopia, left eye: Secondary | ICD-10-CM | POA: Diagnosis not present

## 2024-06-02 DIAGNOSIS — D869 Sarcoidosis, unspecified: Secondary | ICD-10-CM | POA: Diagnosis not present

## 2024-06-02 DIAGNOSIS — Z23 Encounter for immunization: Secondary | ICD-10-CM | POA: Diagnosis not present

## 2024-06-02 DIAGNOSIS — E669 Obesity, unspecified: Secondary | ICD-10-CM | POA: Diagnosis not present

## 2024-06-02 DIAGNOSIS — E1122 Type 2 diabetes mellitus with diabetic chronic kidney disease: Secondary | ICD-10-CM | POA: Diagnosis not present

## 2024-06-02 DIAGNOSIS — I10 Essential (primary) hypertension: Secondary | ICD-10-CM | POA: Diagnosis not present

## 2024-06-02 DIAGNOSIS — Z0000 Encounter for general adult medical examination without abnormal findings: Secondary | ICD-10-CM | POA: Diagnosis not present

## 2024-06-02 DIAGNOSIS — E7800 Pure hypercholesterolemia, unspecified: Secondary | ICD-10-CM | POA: Diagnosis not present

## 2024-06-02 DIAGNOSIS — E039 Hypothyroidism, unspecified: Secondary | ICD-10-CM | POA: Diagnosis not present

## 2024-06-22 DIAGNOSIS — K080 Exfoliation of teeth due to systemic causes: Secondary | ICD-10-CM | POA: Diagnosis not present

## 2024-07-14 DIAGNOSIS — J069 Acute upper respiratory infection, unspecified: Secondary | ICD-10-CM | POA: Diagnosis not present
